# Patient Record
Sex: Male | Born: 1960 | Race: White | Hispanic: No | Marital: Married | Smoking: Never smoker
Health system: Southern US, Community
[De-identification: ages and names within clinical notes are randomized; demographics above are authoritative.]

## PROBLEM LIST (undated history)

## (undated) DIAGNOSIS — Z87442 Personal history of urinary calculi: Secondary | ICD-10-CM

## (undated) DIAGNOSIS — E039 Hypothyroidism, unspecified: Secondary | ICD-10-CM

## (undated) DIAGNOSIS — R7303 Prediabetes: Secondary | ICD-10-CM

## (undated) DIAGNOSIS — N200 Calculus of kidney: Secondary | ICD-10-CM

## (undated) DIAGNOSIS — E785 Hyperlipidemia, unspecified: Secondary | ICD-10-CM

## (undated) DIAGNOSIS — K635 Polyp of colon: Secondary | ICD-10-CM

## (undated) HISTORY — DX: Personal history of urinary calculi: Z87.442

## (undated) HISTORY — PX: TONSILLECTOMY AND ADENOIDECTOMY: SHX28

## (undated) HISTORY — DX: Hyperlipidemia, unspecified: E78.5

## (undated) HISTORY — DX: Prediabetes: R73.03

## (undated) HISTORY — DX: Hypothyroidism, unspecified: E03.9

## (undated) HISTORY — DX: Polyp of colon: K63.5

## (undated) HISTORY — PX: HALLUX VALGUS CORRECTION: SUR315

---

## 2008-01-25 ENCOUNTER — Ambulatory Visit: Payer: Self-pay | Admitting: Podiatry

## 2008-02-02 ENCOUNTER — Ambulatory Visit: Payer: Self-pay | Admitting: Podiatry

## 2012-05-05 ENCOUNTER — Ambulatory Visit: Payer: Self-pay | Admitting: Podiatry

## 2012-07-11 ENCOUNTER — Emergency Department: Payer: Self-pay | Admitting: Emergency Medicine

## 2012-07-11 LAB — CBC
HCT: 43.6 % (ref 40.0–52.0)
HGB: 14.9 g/dL (ref 13.0–18.0)
MCH: 30.3 pg (ref 26.0–34.0)
MCHC: 34.2 g/dL (ref 32.0–36.0)
MCV: 89 fL (ref 80–100)
Platelet: 190 10*3/uL (ref 150–440)
RBC: 4.92 10*6/uL (ref 4.40–5.90)
RDW: 13.4 % (ref 11.5–14.5)
WBC: 10.2 10*3/uL (ref 3.8–10.6)

## 2012-07-11 LAB — URINALYSIS, COMPLETE
Bilirubin,UR: NEGATIVE
Glucose,UR: NEGATIVE mg/dL (ref 0–75)
Granular Cast: 1
Hyaline Cast: 8
Nitrite: NEGATIVE
Protein: 30
RBC,UR: 231 /HPF (ref 0–5)
Specific Gravity: 1.025 (ref 1.003–1.030)
WBC UR: 5 /HPF (ref 0–5)

## 2012-07-11 LAB — BASIC METABOLIC PANEL
Anion Gap: 8 (ref 7–16)
Calcium, Total: 9.6 mg/dL (ref 8.5–10.1)
Chloride: 106 mmol/L (ref 98–107)
Co2: 25 mmol/L (ref 21–32)
EGFR (African American): 58 — ABNORMAL LOW
Glucose: 134 mg/dL — ABNORMAL HIGH (ref 65–99)
Osmolality: 282 (ref 275–301)
Potassium: 3.8 mmol/L (ref 3.5–5.1)

## 2013-10-26 ENCOUNTER — Ambulatory Visit: Payer: Self-pay | Admitting: Gastroenterology

## 2013-10-26 HISTORY — PX: COLONOSCOPY: SHX174

## 2013-10-29 LAB — PATHOLOGY REPORT

## 2014-04-24 ENCOUNTER — Emergency Department: Payer: Self-pay | Admitting: Emergency Medicine

## 2014-04-24 LAB — URINALYSIS, COMPLETE
Bacteria: NONE SEEN
Bilirubin,UR: NEGATIVE
Blood: NEGATIVE
Glucose,UR: NEGATIVE mg/dL (ref 0–75)
Ketone: NEGATIVE
Nitrite: NEGATIVE
Ph: 5 (ref 4.5–8.0)
Protein: NEGATIVE
RBC,UR: 6 /HPF (ref 0–5)
Specific Gravity: 1.024 (ref 1.003–1.030)
Squamous Epithelial: NONE SEEN

## 2014-04-24 LAB — BASIC METABOLIC PANEL
ANION GAP: 8 (ref 7–16)
BUN: 14 mg/dL (ref 7–18)
CREATININE: 1.34 mg/dL — AB (ref 0.60–1.30)
Calcium, Total: 9.1 mg/dL (ref 8.5–10.1)
Chloride: 101 mmol/L (ref 98–107)
Co2: 26 mmol/L (ref 21–32)
GFR CALC NON AF AMER: 60 — AB
Glucose: 118 mg/dL — ABNORMAL HIGH (ref 65–99)
OSMOLALITY: 272 (ref 275–301)
Potassium: 3.6 mmol/L (ref 3.5–5.1)
Sodium: 135 mmol/L — ABNORMAL LOW (ref 136–145)

## 2014-04-24 LAB — CBC
HCT: 45.9 % (ref 40.0–52.0)
HGB: 15.2 g/dL (ref 13.0–18.0)
MCH: 29.6 pg (ref 26.0–34.0)
MCHC: 33.2 g/dL (ref 32.0–36.0)
MCV: 89 fL (ref 80–100)
PLATELETS: 218 10*3/uL (ref 150–440)
RBC: 5.15 10*6/uL (ref 4.40–5.90)
RDW: 13.7 % (ref 11.5–14.5)
WBC: 9 10*3/uL (ref 3.8–10.6)

## 2014-09-10 NOTE — Op Note (Signed)
PATIENT NAME:  Brandon Benitez, Brandon Benitez MR#:  [id number] DATE OF BIRTH:  1960/08/01  DATE OF PROCEDURE:  05/05/2012  PREOPERATIVE DIAGNOSIS: Hallux valgus deformity, right foot.   POSTOPERATIVE DIAGNOSIS: Hallux valgus deformity, right foot.   PROCEDURE: Hallux valgus correction, right foot.   SURGEON: Linus Galasodd Shamiyah Ngu, DPM  ANESTHESIA: Local MAC.   HEMOSTASIS: Pneumatic tourniquet, right ankle, 250 mmHg.   ESTIMATED BLOOD LOSS: Minimal.   MATERIALS: One 0.062-inch K wire.   PATHOLOGY: None.   COMPLICATIONS: None apparent.   OPERATIVE INDICATIONS: This is a 54 year old male with some chronic pain with hallux valgus deformity on his right foot. He elects for surgical intervention as was previously performed on the left foot.   OPERATIVE PROCEDURE: The patient was taken to the operating room and placed on the table in the supine position. Following satisfactory sedation, the right foot was anesthetized with 10 mL of 0.5% Sensorcaine plain around the first metatarsal. Pneumatic tourniquet was applied at the level of the right ankle and the foot was prepped and draped in the usual sterile fashion. The foot was exsanguinated and the tourniquet inflated to 250 mmHg.   Attention was then directed to the dorsal aspect of the right foot where an approximate 5 cm linear incision was made coursing proximal to distal centered over the first metatarsal and metatarsophalangeal joint. The incision was deepened via sharp and blunt dissection down to the level of the joint where a linear capsulotomy was performed. The capsular and periosteal tissues were reflected off of the medial and dorsal aspect of the head of the first metatarsal. There was a medial prominence with some arthritic cartilage loss along the medial joint line. Using a pneumatic saw, the medial eminence and dorsal prominence were removed in toto using a pneumatic saw. Next, a 0.045-inch K wire was then driven from medial to lateral as an axis guide and a  V-shaped osteotomy was performed through the head of the first metatarsal. The head was freely mobilized and the pin was removed.   Attention was then directed to the first interspace where the incision was deepened via sharp and blunt dissection down to the level of the transverse intermetatarsal ligament which was incised. The adductor tendon was freed from the lateral aspect of the joint and a small portion was removed. A lateral capsulotomy with freeing of the sesamoid apparatus was performed. There was noted to be good release of the contracture on the great toe. Attention was then directed back medially where the capital fragment was mobilized laterally and fixated using a 0.062-inch K wire. Intraoperative FluoroScan views revealed good reduction of the deformity and K wire placement. The redundant bone medially was then resected and the edges were rasped smooth. The wound was then flushed with copious amounts of sterile saline and closed in layers using 4-0 Vicryl running suture for all layers from capsular and periosteal closure to deep and superficial and skin closure. Tincture of benzoin and Steri-Strips were applied followed by Xeroform and a sterile bandage. Tourniquet was released and blood flow noted to return immediately to the right foot in digits one through five. A posterior plaster splint was then applied to the right foot with the foot 90 degrees relative to the leg. The patient tolerated the procedure and anesthesia well and was transported to PAC-U with vital signs stable and in good condition.   ____________________________ Linus Galasodd Winna Golla, DPM tc:drc D: 05/05/2012 09:20:20 ET T: 05/05/2012 09:47:35 ET JOB#: [id number]  cc: Linus Galasodd Jesson Foskey, DPM, <Dictator> Klinton Candelas DPM  ELECTRONICALLY SIGNED 05/23/2012 14:57

## 2014-11-06 ENCOUNTER — Encounter: Payer: Self-pay | Admitting: Emergency Medicine

## 2014-11-06 ENCOUNTER — Emergency Department: Payer: BLUE CROSS/BLUE SHIELD

## 2014-11-06 ENCOUNTER — Emergency Department
Admission: EM | Admit: 2014-11-06 | Discharge: 2014-11-06 | Disposition: A | Discharge status: Home / Self Care | Payer: BLUE CROSS/BLUE SHIELD | Attending: Student | Admitting: Student

## 2014-11-06 DIAGNOSIS — R1030 Lower abdominal pain, unspecified: Secondary | ICD-10-CM | POA: Diagnosis present

## 2014-11-06 DIAGNOSIS — N200 Calculus of kidney: Secondary | ICD-10-CM | POA: Insufficient documentation

## 2014-11-06 HISTORY — DX: Calculus of kidney: N20.0

## 2014-11-06 LAB — URINALYSIS COMPLETE WITH MICROSCOPIC (ARMC ONLY)
BACTERIA UA: NONE SEEN
BILIRUBIN URINE: NEGATIVE
GLUCOSE, UA: NEGATIVE mg/dL
KETONES UR: NEGATIVE mg/dL
Leukocytes, UA: NEGATIVE
Nitrite: NEGATIVE
Protein, ur: NEGATIVE mg/dL
SPECIFIC GRAVITY, URINE: 1.016 (ref 1.005–1.030)
pH: 5 (ref 5.0–8.0)

## 2014-11-06 LAB — COMPREHENSIVE METABOLIC PANEL
ALBUMIN: 4.2 g/dL (ref 3.5–5.0)
ALT: 34 U/L (ref 17–63)
ANION GAP: 8 (ref 5–15)
AST: 35 U/L (ref 15–41)
Alkaline Phosphatase: 51 U/L (ref 38–126)
BUN: 12 mg/dL (ref 6–20)
CHLORIDE: 105 mmol/L (ref 101–111)
CO2: 25 mmol/L (ref 22–32)
Calcium: 9.2 mg/dL (ref 8.9–10.3)
Creatinine, Ser: 1.52 mg/dL — ABNORMAL HIGH (ref 0.61–1.24)
GFR calc Af Amer: 60 mL/min — ABNORMAL LOW (ref >60)
GFR, EST NON AFRICAN AMERICAN: 51 mL/min — AB (ref >60)
Glucose, Bld: 180 mg/dL — ABNORMAL HIGH (ref 65–99)
Potassium: 3.7 mmol/L (ref 3.5–5.1)
SODIUM: 138 mmol/L (ref 135–145)
TOTAL PROTEIN: 6.9 g/dL (ref 6.5–8.1)
Total Bilirubin: 0.4 mg/dL (ref 0.3–1.2)

## 2014-11-06 LAB — CBC WITH DIFFERENTIAL/PLATELET
Basophils Absolute: 0.1 10*3/uL (ref 0–0.1)
Basophils Relative: 1 %
EOS ABS: 0.3 10*3/uL (ref 0–0.7)
EOS PCT: 5 %
HEMATOCRIT: 43.2 % (ref 40.0–52.0)
Hemoglobin: 14.4 g/dL (ref 13.0–18.0)
LYMPHS PCT: 29 %
Lymphs Abs: 1.8 10*3/uL (ref 1.0–3.6)
MCH: 29.7 pg (ref 26.0–34.0)
MCHC: 33.4 g/dL (ref 32.0–36.0)
MCV: 89 fL (ref 80.0–100.0)
MONO ABS: 0.2 10*3/uL (ref 0.2–1.0)
Monocytes Relative: 4 %
Neutro Abs: 3.7 10*3/uL (ref 1.4–6.5)
Neutrophils Relative %: 61 %
PLATELETS: 173 10*3/uL (ref 150–440)
RBC: 4.86 MIL/uL (ref 4.40–5.90)
RDW: 13.6 % (ref 11.5–14.5)
WBC: 6.2 10*3/uL (ref 3.8–10.6)

## 2014-11-06 LAB — LIPASE, BLOOD: Lipase: 35 U/L (ref 22–51)

## 2014-11-06 MED ORDER — MORPHINE SULFATE 4 MG/ML IJ SOLN
INTRAMUSCULAR | Status: AC
  Administered 2014-11-06: 4 mg via INTRAVENOUS
  Filled 2014-11-06: qty 1

## 2014-11-06 MED ORDER — ONDANSETRON HCL 4 MG/2ML IJ SOLN
INTRAMUSCULAR | Status: AC
  Administered 2014-11-06: 4 mg via INTRAVENOUS
  Filled 2014-11-06: qty 2

## 2014-11-06 MED ORDER — OXYCODONE HCL 5 MG PO TABS: 5.0000 mg | ORAL_TABLET | Freq: Four times a day (QID) | ORAL | Status: DC | PRN

## 2014-11-06 MED ORDER — ONDANSETRON 4 MG PO TBDP: 4.0000 mg | ORAL_TABLET | Freq: Three times a day (TID) | ORAL | Status: DC | PRN

## 2014-11-06 MED ORDER — KETOROLAC TROMETHAMINE 30 MG/ML IJ SOLN
30.0000 mg | Freq: Once | INTRAMUSCULAR | Status: AC
  Administered 2014-11-06: 30 mg via INTRAVENOUS

## 2014-11-06 MED ORDER — ONDANSETRON HCL 4 MG/2ML IJ SOLN
4.0000 mg | Freq: Once | INTRAMUSCULAR | Status: AC
  Administered 2014-11-06: 4 mg via INTRAVENOUS

## 2014-11-06 MED ORDER — KETOROLAC TROMETHAMINE 30 MG/ML IJ SOLN
INTRAMUSCULAR | Status: AC
  Administered 2014-11-06: 30 mg via INTRAVENOUS
  Filled 2014-11-06: qty 1

## 2014-11-06 MED ORDER — TAMSULOSIN HCL 0.4 MG PO CAPS: 0.4000 mg | ORAL_CAPSULE | Freq: Every day | ORAL | Status: DC

## 2014-11-06 MED ORDER — SODIUM CHLORIDE 0.9 % IV BOLUS (SEPSIS)
500.0000 mL | Freq: Once | INTRAVENOUS | Status: AC
  Administered 2014-11-06: 500 mL via INTRAVENOUS

## 2014-11-06 MED ORDER — MORPHINE SULFATE 4 MG/ML IJ SOLN
4.0000 mg | Freq: Once | INTRAMUSCULAR | Status: AC
  Administered 2014-11-06: 4 mg via INTRAVENOUS

## 2014-11-06 NOTE — ED Notes (Signed)
Pt presents with lower abd pain, possible kidney stones.

## 2014-11-06 NOTE — ED Provider Notes (Signed)
[facility] Emergency Department Provider Note  ____________________________________________  Time seen: Approximately 7:52 AM  I have reviewed the triage vital signs and the nursing notes.   HISTORY  Chief Complaint Abdominal Pain    HPI Brandon Benitez is a 54 y.o. male with history of hyperlipidemia, kidney stones who presents for evaluation of 4 days constant throbbing central lower abdominal pain, gradual onset, worsening today. He is also having urinary hesitancy. No nausea, vomiting, diarrhea, fevers or chills. Current severity is moderate. No modifying factors. No chest pain or difficulty breathing. He reports it "feels like a kidney stone but is in my bladder".   Past Medical History  Diagnosis Date  . Kidney stones   . Kidney stones     There are no active problems to display for this patient.   History reviewed. No pertinent past surgical history.  No current outpatient prescriptions on file.  Allergies Review of patient's allergies indicates no known allergies.  No family history on file.  Social History History  Substance Use Topics  . Smoking status: Never Smoker   . Smokeless tobacco: Not on file  . Alcohol Use: No    Review of Systems Constitutional: No fever/chills Eyes: No visual changes. ENT: No sore throat. Cardiovascular: Denies chest pain. Respiratory: Denies shortness of breath. Gastrointestinal: + abdominal pain.  No nausea, no vomiting.  No diarrhea.  No constipation. Genitourinary: Negative for dysuria. Musculoskeletal: Negative for back pain. Skin: Negative for rash. Neurological: Negative for headaches, focal weakness or numbness.  10-point ROS otherwise negative.  ____________________________________________   PHYSICAL EXAM:  VITAL SIGNS: ED Triage Vitals  Enc Vitals Group     BP 11/06/14 0743 127/76 mmHg     Pulse Rate 11/06/14 0743 66     Resp 11/06/14 0743 18     Temp 11/06/14 0743 98.2 F  (36.8 C)     Temp Source 11/06/14 0743 Oral     SpO2 11/06/14 0743 99 %     Weight 11/06/14 0740 180 lb (81.647 kg)     Height 11/06/14 0740 5\' 7"  (1.702 m)     Head Cir --      Peak Flow --      Pain Score 11/06/14 0741 10     Pain Loc --      Pain Edu? --      Excl. in GC? --     Constitutional: Alert and oriented. Well appearing and in no acute distress. Eyes: Conjunctivae are normal. PERRL. EOMI. Head: Atraumatic. Nose: No congestion/rhinnorhea. Mouth/Throat: Mucous membranes are moist.  Oropharynx non-erythematous. Neck: No stridor.   Cardiovascular: Normal rate, regular rhythm. Grossly normal heart sounds.  Good peripheral circulation. Respiratory: Normal respiratory effort.  No retractions. Lungs CTAB. Gastrointestinal: Soft and with faint suprapubic tenderness, no tenderness to palpation in the RLQ or RUQ. No distention. No abdominal bruits. No CVA tenderness. Genitourinary: deferred Musculoskeletal: No lower extremity tenderness nor edema.  No joint effusions. Neurologic:  Normal speech and language. No gross focal neurologic deficits are appreciated. Speech is normal. No gait instability. Skin:  Skin is warm, dry and intact. No rash noted. Psychiatric: Mood and affect are normal. Speech and behavior are normal.  ____________________________________________   LABS (all labs ordered are listed, but only abnormal results are displayed)  Labs Reviewed  COMPREHENSIVE METABOLIC PANEL - Abnormal; Notable for the following:    Glucose, Bld 180 (*)    Creatinine, Ser 1.52 (*)    GFR calc non Af Denyse Dago  51 (*)    GFR calc Af Amer 60 (*)    All other components within normal limits  URINALYSIS COMPLETEWITH MICROSCOPIC ([facility] ONLY) - Abnormal; Notable for the following:    Color, Urine YELLOW (*)    APPearance CLEAR (*)    Hgb urine dipstick 3+ (*)    Squamous Epithelial / LPF 0-5 (*)    All other components within normal limits  CBC WITH DIFFERENTIAL/PLATELET  LIPASE,  BLOOD   ____________________________________________  EKG  none ____________________________________________  RADIOLOGY CT Abdomen and pelvis IMPRESSION: 1. Again noted multiple calcified nonobstructive renal calculi bilaterally. 2. There is mild left hydronephrosis and left hydroureter. Axial image 75 there is 2.2 mm calcified obstructive calculus in distal left ureter about 9 mm from left UVJ. 3. No right hydronephrosis. 4. Moderate stool noted in right colon and transverse colon. No pericecal inflammation. Normal appendix partially visualized. 5. No small bowel obstruction. 6. Again noted hepatic steatosis.  ____________________________________________   PROCEDURES  Procedure(s) performed: None  Critical Care performed: No  ____________________________________________   INITIAL IMPRESSION / ASSESSMENT AND PLAN / ED COURSE  Pertinent labs & imaging results that were available during my care of the patient were reviewed by me and considered in my medical decision making (see chart for details).  Brandon Benitez is a 54 y.o. male with history of hyperlipidemia, kidney stones who presents for evaluation of 4 days constant throbbing central lower abdominal pain, gradual onset, worsening today. On exam, he is generally well-appearing. Vital signs stable, he is afebrile. He has very faint suprapubic tenderness. Plan for screening labs, pain control, urinalysis possible CT of the abdomen and pelvis.  ----------------------------------------- 10:59 AM on 11/06/2014 -----------------------------------------  CT shows 2.2 mm obstructive calculus in the distal left ureter with mild left hydronephrosis. Discussed with Dr. Apolinar Junes who recommends discharge with expectant management. Patient reports his pain is currently well controlled. UA does not appear consistent with bacterial infection, yeast are present but the patient is not at risk for disseminated infection and there are  no indications for treatment currently.  Cr at baseline. Discussed return precautions, expectant management. He is comfortable with the discharge plan. ____________________________________________   FINAL CLINICAL IMPRESSION(S) / ED DIAGNOSES  Final diagnoses:  Suprapubic pain, acute, unspecified laterality  Nephrolithiasis      Gayla Doss, MD 11/06/14 1103

## 2014-11-06 NOTE — ED Notes (Signed)
Patient transported to CT 

## 2014-11-18 ENCOUNTER — Ambulatory Visit: Payer: Self-pay | Admitting: Urology

## 2016-10-16 ENCOUNTER — Emergency Department
Admission: EM | Admit: 2016-10-16 | Discharge: 2016-10-16 | Disposition: A | Discharge status: Home / Self Care | Payer: Managed Care, Other (non HMO) | Attending: Emergency Medicine | Admitting: Emergency Medicine

## 2016-10-16 ENCOUNTER — Emergency Department: Payer: Managed Care, Other (non HMO)

## 2016-10-16 DIAGNOSIS — N200 Calculus of kidney: Secondary | ICD-10-CM

## 2016-10-16 DIAGNOSIS — Z7902 Long term (current) use of antithrombotics/antiplatelets: Secondary | ICD-10-CM | POA: Diagnosis not present

## 2016-10-16 DIAGNOSIS — R1032 Left lower quadrant pain: Secondary | ICD-10-CM | POA: Diagnosis present

## 2016-10-16 DIAGNOSIS — Z79899 Other long term (current) drug therapy: Secondary | ICD-10-CM | POA: Diagnosis not present

## 2016-10-16 LAB — URINALYSIS, COMPLETE (UACMP) WITH MICROSCOPIC
Bacteria, UA: NONE SEEN
Bilirubin Urine: NEGATIVE
Glucose, UA: NEGATIVE mg/dL
KETONES UR: NEGATIVE mg/dL
Leukocytes, UA: NEGATIVE
Nitrite: NEGATIVE
PH: 5 (ref 5.0–8.0)
Protein, ur: NEGATIVE mg/dL
Specific Gravity, Urine: 1.02 (ref 1.005–1.030)
Squamous Epithelial / LPF: NONE SEEN

## 2016-10-16 LAB — COMPREHENSIVE METABOLIC PANEL
ALT: 31 U/L (ref 17–63)
AST: 33 U/L (ref 15–41)
Albumin: 4.5 g/dL (ref 3.5–5.0)
Alkaline Phosphatase: 49 U/L (ref 38–126)
Anion gap: 7 (ref 5–15)
BUN: 19 mg/dL (ref 6–20)
CHLORIDE: 102 mmol/L (ref 101–111)
CO2: 24 mmol/L (ref 22–32)
Calcium: 9.3 mg/dL (ref 8.9–10.3)
Creatinine, Ser: 1.37 mg/dL — ABNORMAL HIGH (ref 0.61–1.24)
GFR calc Af Amer: >60 mL/min (ref >60)
GFR calc non Af Amer: 57 mL/min — ABNORMAL LOW (ref >60)
Glucose, Bld: 132 mg/dL — ABNORMAL HIGH (ref 65–99)
POTASSIUM: 4.4 mmol/L (ref 3.5–5.1)
Sodium: 133 mmol/L — ABNORMAL LOW (ref 135–145)
Total Bilirubin: 0.6 mg/dL (ref 0.3–1.2)
Total Protein: 7.2 g/dL (ref 6.5–8.1)

## 2016-10-16 LAB — CBC
HCT: 44.4 % (ref 40.0–52.0)
Hemoglobin: 15 g/dL (ref 13.0–18.0)
MCH: 29 pg (ref 26.0–34.0)
MCHC: 33.8 g/dL (ref 32.0–36.0)
MCV: 85.7 fL (ref 80.0–100.0)
Platelets: 213 10*3/uL (ref 150–440)
RBC: 5.19 MIL/uL (ref 4.40–5.90)
RDW: 13.8 % (ref 11.5–14.5)
WBC: 11.1 10*3/uL — ABNORMAL HIGH (ref 3.8–10.6)

## 2016-10-16 LAB — LIPASE, BLOOD: Lipase: 25 U/L (ref 11–51)

## 2016-10-16 MED ORDER — ONDANSETRON 4 MG PO TBDP: 4.0000 mg | ORAL_TABLET | Freq: Four times a day (QID) | ORAL | 0 refills | Status: DC | PRN

## 2016-10-16 MED ORDER — KETOROLAC TROMETHAMINE 30 MG/ML IJ SOLN
30.0000 mg | Freq: Once | INTRAMUSCULAR | Status: AC
  Administered 2016-10-16: 30 mg via INTRAVENOUS

## 2016-10-16 MED ORDER — KETOROLAC TROMETHAMINE 30 MG/ML IJ SOLN
INTRAMUSCULAR | Status: AC
  Administered 2016-10-16: 30 mg via INTRAVENOUS
  Filled 2016-10-16: qty 1

## 2016-10-16 MED ORDER — TAMSULOSIN HCL 0.4 MG PO CAPS: 0.4000 mg | ORAL_CAPSULE | Freq: Every day | ORAL | 0 refills | Status: DC

## 2016-10-16 MED ORDER — SODIUM CHLORIDE 0.9 % IV BOLUS (SEPSIS)
1000.0000 mL | Freq: Once | INTRAVENOUS | Status: AC
  Administered 2016-10-16: 1000 mL via INTRAVENOUS

## 2016-10-16 MED ORDER — OXYCODONE HCL 5 MG PO TABS: 5.0000 mg | ORAL_TABLET | Freq: Four times a day (QID) | ORAL | 0 refills | Status: DC | PRN

## 2016-10-16 MED ORDER — OXYCODONE-ACETAMINOPHEN 5-325 MG PO TABS
1.0000 | ORAL_TABLET | ORAL | Status: AC
  Administered 2016-10-16: 1 via ORAL
  Filled 2016-10-16: qty 1

## 2016-10-16 MED ORDER — ONDANSETRON HCL 4 MG/2ML IJ SOLN
4.0000 mg | Freq: Once | INTRAMUSCULAR | Status: AC
  Administered 2016-10-16: 4 mg via INTRAVENOUS
  Filled 2016-10-16: qty 2

## 2016-10-16 NOTE — ED Provider Notes (Signed)
[facility] Emergency Department Provider Note  ____________________________________________   First MD Initiated Contact with Patient 10/16/16 1542     (approximate)  I have reviewed the triage vital signs and the nursing notes.   HISTORY  Chief Complaint Abdominal Pain  HPI Brandon Benitez is a 56 y.o. male who reports that he is a "kidney stone" on the left.  Patient reports he has a history of kidney stones and today at about 10:00 PM extrinsic pain like someone "punched him" in the left back. He reports he's been having a steady constant pain in the left back and sharp and difficult to really relieve. No pain in the groin or testicles. Some nausea but no vomiting. No chest pain or trouble breathing has not seen blood in his urine. No fevers or chills  Took a Flomax tablet at home.  Past Medical History:  Diagnosis Date  . Kidney stones   . Kidney stones     There are no active problems to display for this patient.   History reviewed. No pertinent surgical history.  Prior to Admission medications   Medication Sig Start Date End Date Taking? Authorizing Provider  fenofibrate (TRICOR) 145 MG tablet Take 145 mg by mouth daily. 08/29/16  Yes [provider]  levothyroxine (SYNTHROID, LEVOTHROID) 125 MCG tablet Take 125 mcg by mouth daily. 08/29/16  Yes [provider]  omeprazole (PRILOSEC) 20 MG capsule Take 20 mg by mouth daily. 09/08/16  Yes [provider]  simvastatin (ZOCOR) 40 MG tablet Take 40 mg by mouth daily. 09/08/16  Yes [provider]  ondansetron (ZOFRAN ODT) 4 MG disintegrating tablet Take 1 tablet (4 mg total) by mouth every 6 (six) hours as needed for nausea or vomiting. 10/16/16   Sharyn Creamer, MD  oxyCODONE (OXY IR/ROXICODONE) 5 MG immediate release tablet Take 1 tablet (5 mg total) by mouth every 6 (six) hours as needed for severe pain. 10/16/16   Sharyn Creamer, MD  tamsulosin (FLOMAX) 0.4 MG CAPS  capsule Take 1 capsule (0.4 mg total) by mouth daily. 10/16/16   Sharyn Creamer, MD    Allergies Patient has no known allergies.  No family history on file.  Social History Social History  Substance Use Topics  . Smoking status: Never Smoker  . Smokeless tobacco: Not on file  . Alcohol use No    Review of Systems Constitutional: No fever/chills Eyes: No visual changes. ENT: No sore throat. Cardiovascular: Denies chest pain. Respiratory: Denies shortness of breath. Gastrointestinal: See history of present illness No vomiting.  No diarrhea.  No constipation. Genitourinary: Negative for dysuria. Musculoskeletal: Negative for back pain. Skin: Negative for rash. Neurological: Negative for headaches, focal weakness or numbness.  10-point ROS otherwise negative.  ____________________________________________   PHYSICAL EXAM:  VITAL SIGNS: ED Triage Vitals  Enc Vitals Group     BP 10/16/16 1505 122/74     Pulse Rate 10/16/16 1505 64     Resp 10/16/16 1505 19     Temp 10/16/16 1505 97.8 F (36.6 C)     Temp Source 10/16/16 1505 Oral     SpO2 10/16/16 1505 100 %     Weight 10/16/16 1506 180 lb (81.6 kg)     Height 10/16/16 1506 5\' 7"  (1.702 m)     Head Circumference --      Peak Flow --      Pain Score 10/16/16 1505 10     Pain Loc --  Pain Edu? --      Excl. in GC? --     Constitutional: Alert and oriented. Well appearing and in no acute distress. Eyes: Conjunctivae are normal. PERRL. EOMI. Head: Atraumatic. Nose: No congestion/rhinnorhea. Mouth/Throat: Mucous membranes are moist.  Oropharynx non-erythematous. Neck: No stridor.   Cardiovascular: Normal rate, regular rhythm. Grossly normal heart sounds.  Good peripheral circulation. Respiratory: Normal respiratory effort.  No retractions. Lungs CTAB. Gastrointestinal: Soft and nontender.  No abdominal bruits. Moderate pain left flank. No rebound or guarding throughout the abdomen Musculoskeletal: No lower  extremity tenderness nor edema.  No joint effusions. Neurologic:  Normal speech and language. No gross focal neurologic deficits are appreciated.  Skin:  Skin is warm, dry and intact. No rash noted. Psychiatric: Mood and affect are normal. Speech and behavior are normal.  ____________________________________________   LABS (all labs ordered are listed, but only abnormal results are displayed)  Labs Reviewed  CBC - Abnormal; Notable for the following:       Result Value   WBC 11.1 (*)    All other components within normal limits  COMPREHENSIVE METABOLIC PANEL - Abnormal; Notable for the following:    Sodium 133 (*)    Glucose, Bld 132 (*)    Creatinine, Ser 1.37 (*)    GFR calc non Af Amer 57 (*)    All other components within normal limits  URINALYSIS, COMPLETE (UACMP) WITH MICROSCOPIC - Abnormal; Notable for the following:    Color, Urine YELLOW (*)    APPearance CLEAR (*)    Hgb urine dipstick LARGE (*)    All other components within normal limits  LIPASE, BLOOD   ____________________________________________  EKG   ____________________________________________  RADIOLOGY  Ct Renal Stone Study  Result Date: 10/16/2016 CLINICAL DATA:  Left-sided flank pain starting this morning. History of kidney stones. EXAM: CT ABDOMEN AND PELVIS WITHOUT CONTRAST TECHNIQUE: Multidetector CT imaging of the abdomen and pelvis was performed following the standard protocol without IV contrast. COMPARISON:  CT abdomen dated 11/06/2014. FINDINGS: Lower chest: No acute abnormality. Hepatobiliary: Fatty infiltration of the liver. Gallbladder is unremarkable. No bile duct dilatation. Pancreas: Unremarkable. No pancreatic ductal dilatation or surrounding inflammatory changes. Spleen: Normal in appearance. Adrenals/Urinary Tract: Adrenal glands are unremarkable. 3 mm stone within the distal left ureter causing moderate left-sided hydronephrosis with associated perinephric edema. At least 7 left renal  stones, measuring 1-5 mm in size. At least 9 right renal stones, measuring 1-6 mm in size. No right-sided hydronephrosis. No right-sided ureteral stone. Bladder is decompressed. Stomach/Bowel: Bowel is normal in caliber. No bowel wall thickening or evidence of bowel wall inflammation seen. Appendix is normal. Vascular/Lymphatic: No significant vascular findings are present. No enlarged abdominal or pelvic lymph nodes. Reproductive: Prostate is unremarkable. Other: No free fluid or abscess collections seen. No free intraperitoneal air. 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 No acute or suspicious osseous finding. IMPRESSION: 1. 3 mm stone within the distal left ureter causing moderate left-sided hydronephrosis with associated perinephric edema. 2. Bilateral nephrolithiasis. 3. Fatty infiltration of the liver. Electronically Signed   By: Bary RichardStan  Maynard M.D.   On: 10/16/2016 16:16    ____________________________________________   PROCEDURES  Procedure(s) performed: None  Procedures  Critical Care performed: No  ____________________________________________   INITIAL IMPRESSION / ASSESSMENT AND PLAN / ED COURSE  Pertinent labs & imaging results that were available during my care of the patient were reviewed by me and considered in my medical decision making (  see chart for details).  Differential diagnosis includes but is not limited to, abdominal perforation, aortic dissection, cholecystitis, appendicitis, diverticulitis, colitis, esophagitis/gastritis, kidney stone, pyelonephritis, urinary tract infection, aortic aneurysm. All are considered in decision and treatment plan. Based upon the patient's presentation and risk factors, suspect patient has another kidney stone based on the history and exam. We will obtain a repeat CT, discussed ultrasound versus CT with the patient and he would like to have a CAT scan done after we  discussed the risks including relevant risks of radiation.   Clinical Course as of Oct 17 1719  Sat Oct 16, 2016  1710 Patient reports pain free at this time. Reviewed plan of care and CT scan. Patient agreeable and comfortable with outpatient management. He is out of oxycodone, I will give him additional for this kidney stone. His wife is driving home. I will prescribe the patient a narcotic pain medicine due to their condition which I anticipate will cause at least moderate pain short term. I discussed with the patient safe use of narcotic pain medicines, and that they are not to drive, work in dangerous areas, or ever take more than prescribed (no more than 1 pill every 6 hours). We discussed that this is the type of medication that can be  overdosed on and the risks of this type of medicine. Patient is very agreeable to only use as prescribed and to never use more than prescribed.   [MQ]    Clinical Course User Index [MQ] Sharyn Creamer, MD    Patient resting comfortably. No distress. We'll discharge home, following up closely with urology.  Return precautions and treatment recommendations and follow-up discussed with the patient who is agreeable with the plan.  ____________________________________________   FINAL CLINICAL IMPRESSION(S) / ED DIAGNOSES  Final diagnoses:  Kidney stone on left side      NEW MEDICATIONS STARTED DURING THIS VISIT:  New Prescriptions   ONDANSETRON (ZOFRAN ODT) 4 MG DISINTEGRATING TABLET    Take 1 tablet (4 mg total) by mouth every 6 (six) hours as needed for nausea or vomiting.   OXYCODONE (OXY IR/ROXICODONE) 5 MG IMMEDIATE RELEASE TABLET    Take 1 tablet (5 mg total) by mouth every 6 (six) hours as needed for severe pain.   TAMSULOSIN (FLOMAX) 0.4 MG CAPS CAPSULE    Take 1 capsule (0.4 mg total) by mouth daily.     Note:  This document was prepared using Dragon voice recognition software and may include unintentional dictation errors.     Sharyn Creamer, MD 10/16/16 1725

## 2016-10-16 NOTE — ED Triage Notes (Signed)
Pt comes from home for c/o left flank pain that radiates to LLQ pain that started today. Pt states nausea and vomiting. Pt states he took 2 perocets at 11am, 2 more at 2pm, flomax at 11am, zofran tablet at 11am and about 2:30pm. Pt states no relief. Pt appears uncomfortable in triage. Pt vomiting in triage.

## 2016-10-16 NOTE — Discharge Instructions (Signed)

## 2017-10-13 IMAGING — CT CT RENAL STONE PROTOCOL
2 of 4 series · 16 of 46 positions shown, 18 images · non-contrast
Comparison: CT abdomen dated 11/06/2014.

CLINICAL DATA: Left-sided flank pain starting this morning. History
of kidney stones.

EXAM:
CT ABDOMEN AND PELVIS WITHOUT CONTRAST
TECHNIQUE: Multidetector CT imaging of the abdomen and pelvis was performed
following the standard protocol without IV contrast.

[Series 2: stone full standard · axial · 0.81mm/px · z∈[-539,-99]mm · 13 of 98 slices shown, 15 images]
[im 5/98  soft-tissue]
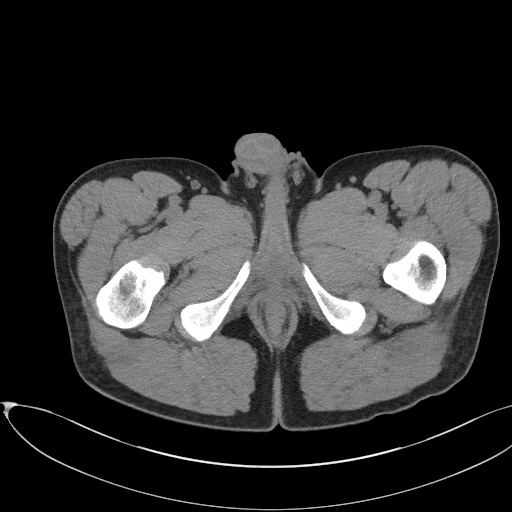
[im 5/98  bone]
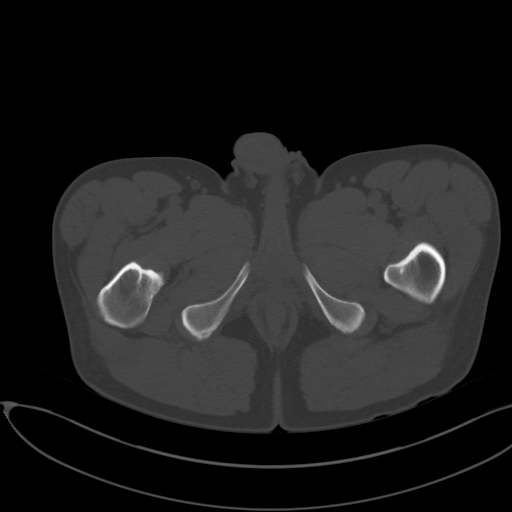
[im 13/98  soft-tissue]
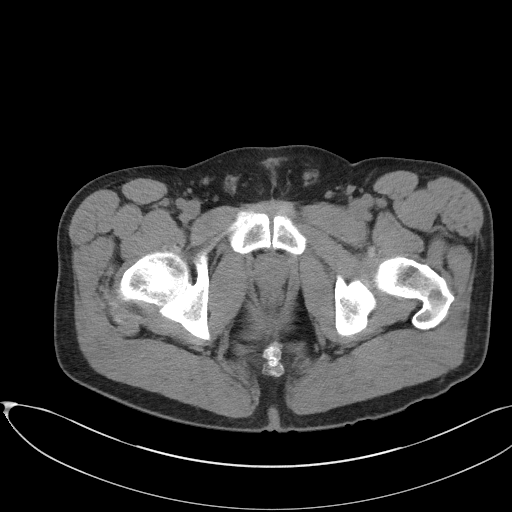
[im 22/98  soft-tissue]
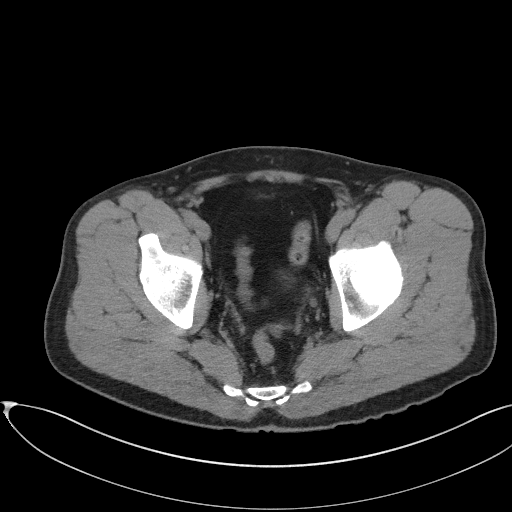
[im 26/98  soft-tissue]
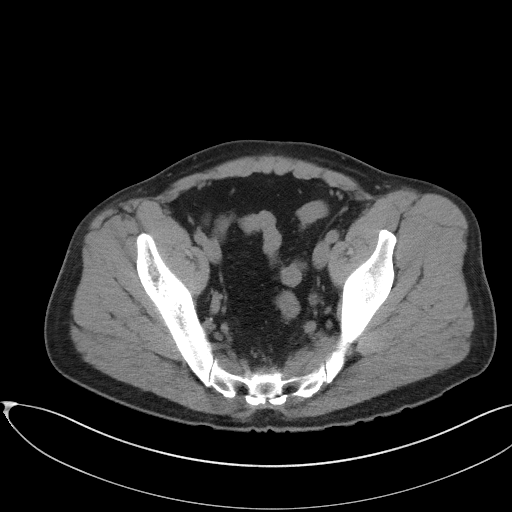
[im 34/98  soft-tissue]
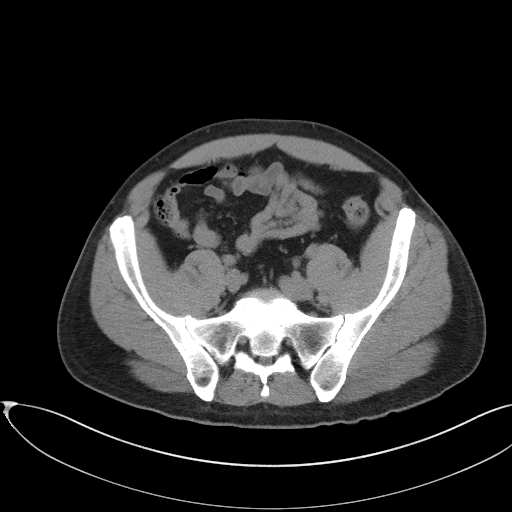
[im 43/98  soft-tissue]
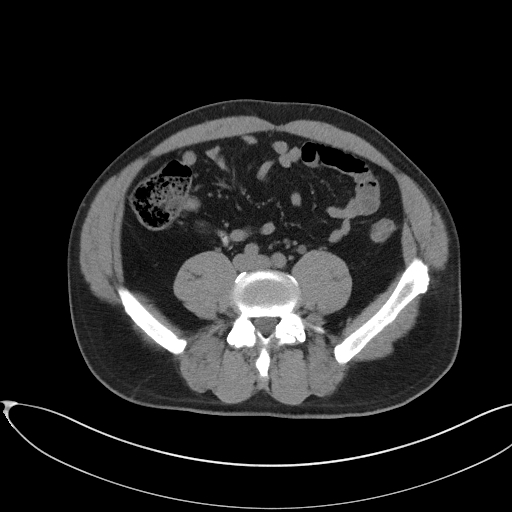
[im 51/98  soft-tissue]
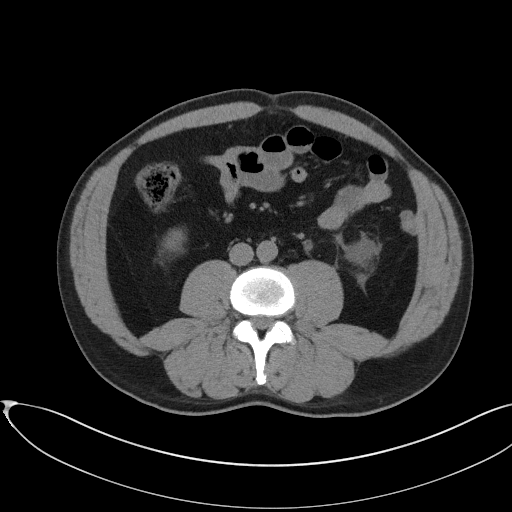
[im 55/98  soft-tissue]
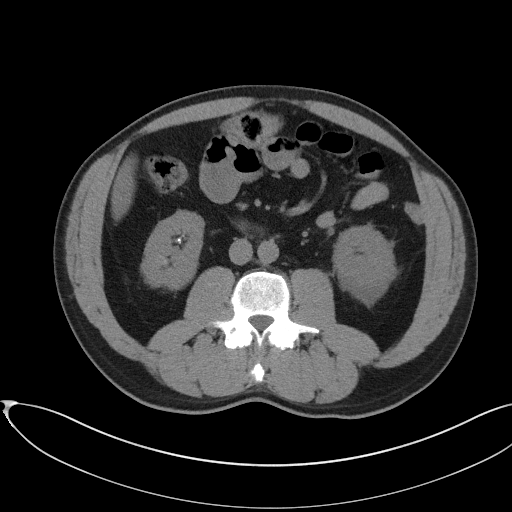
[im 64/98  soft-tissue]
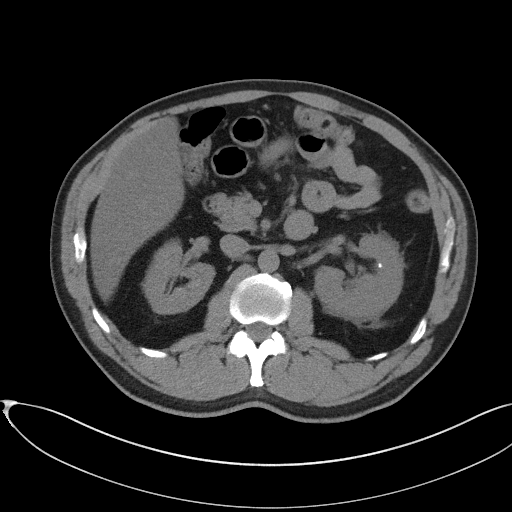
[im 64/98  bone]
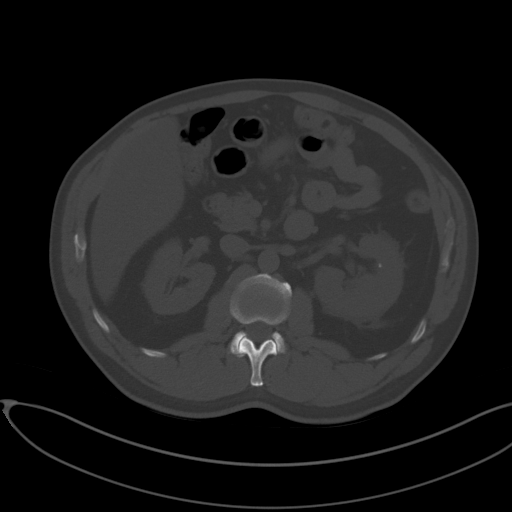
[im 72/98  soft-tissue]
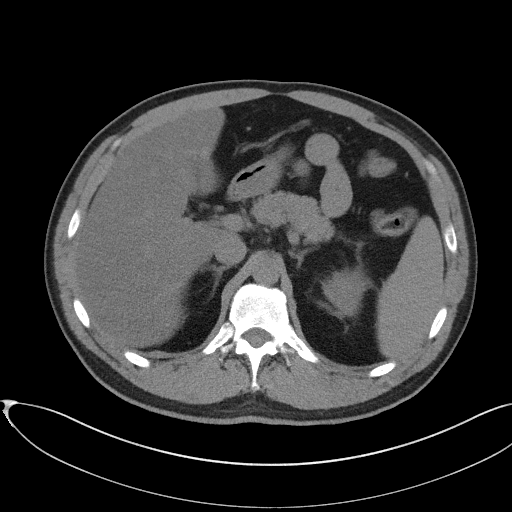
[im 76/98  soft-tissue]
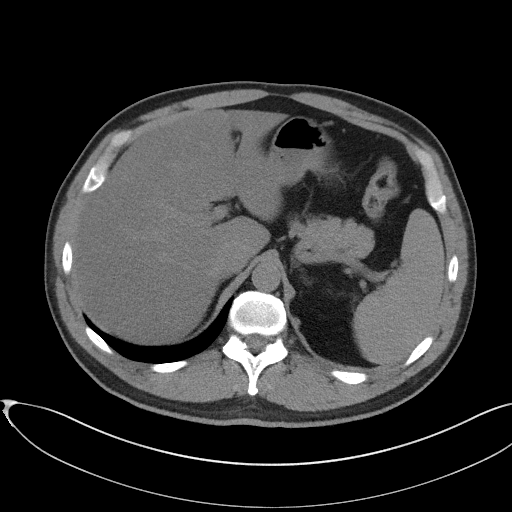
[im 85/98  soft-tissue]
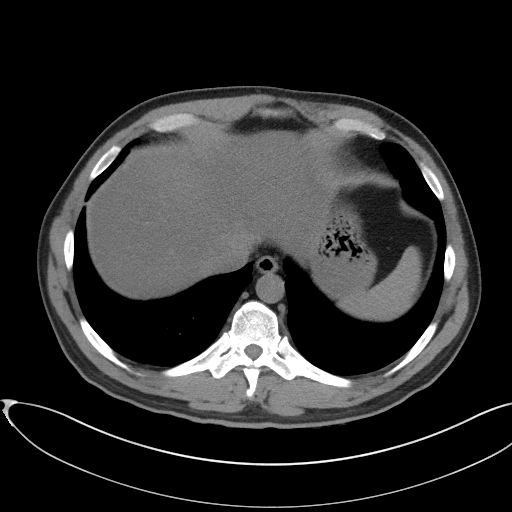
[im 93/98  soft-tissue]
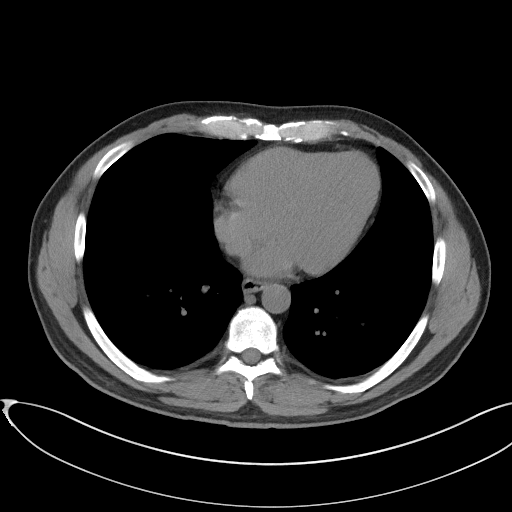

[Series 5: coronal · coronal · 0.73mm/px · 3 of 141 slices shown]
[im 47/141  soft-tissue]
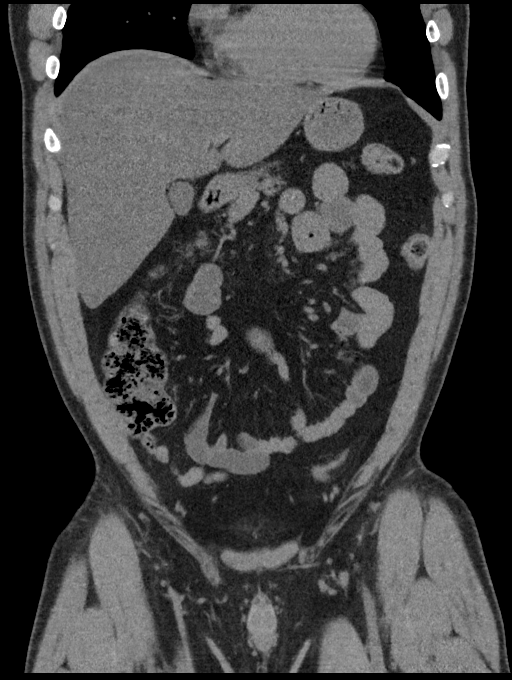
[im 63/141  soft-tissue]
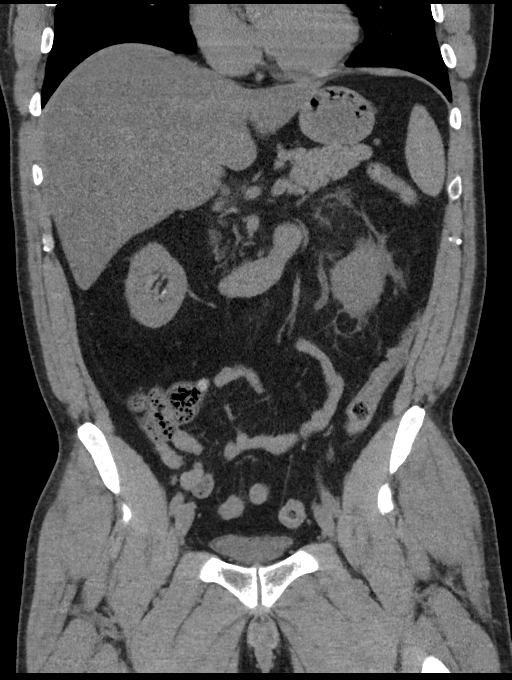
[im 78/141  soft-tissue]
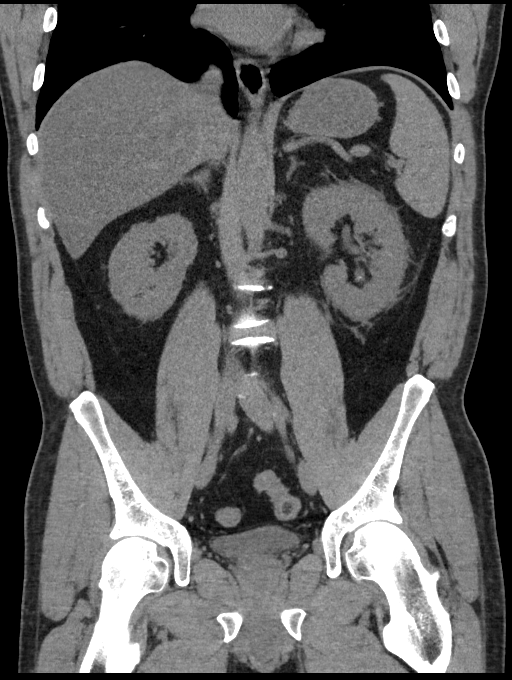

[16 of 46 positions shown; findings below may reference images not displayed]

FINDINGS: Lower chest: No acute abnormality.

Hepatobiliary: Fatty infiltration of the liver. Gallbladder is
unremarkable. No bile duct dilatation.

Pancreas: Unremarkable. No pancreatic ductal dilatation or
surrounding inflammatory changes.

Spleen: Normal in appearance.

Adrenals/Urinary Tract:

Adrenal glands are unremarkable.

3 mm stone within the distal left ureter causing moderate left-sided
hydronephrosis with associated perinephric edema.

At least 7 left renal stones, measuring 1-5 mm in size. At least 9
right renal stones, measuring 1-6 mm in size. No right-sided
hydronephrosis. No right-sided ureteral stone. Bladder is
decompressed.

Stomach/Bowel: Bowel is normal in caliber. No bowel wall thickening
or evidence of bowel wall inflammation seen. Appendix is normal.

Vascular/Lymphatic: No significant vascular findings are present. No
enlarged abdominal or pelvic lymph nodes.

Reproductive: Prostate is unremarkable.

Other: No free fluid or abscess collections seen. No free
intraperitoneal air.

Musculoskeletal: Chronic unilateral left pars interarticularis
defect at L5-S1 without associated spondylolisthesis. No acute or
suspicious osseous finding.
IMPRESSION: 1. 3 mm stone within the distal left ureter causing moderate
left-sided hydronephrosis with associated perinephric edema.
2. Bilateral nephrolithiasis.
3. Fatty infiltration of the liver.

## 2018-07-17 HISTORY — PX: COLONOSCOPY: SHX174

## 2020-06-15 ENCOUNTER — Other Ambulatory Visit: Payer: Self-pay

## 2020-06-15 ENCOUNTER — Encounter: Payer: Self-pay | Admitting: Emergency Medicine

## 2020-06-15 ENCOUNTER — Ambulatory Visit
Admission: EM | Admit: 2020-06-15 | Discharge: 2020-06-15 | Disposition: A | Discharge status: Home / Self Care | Payer: BC Managed Care – PPO | Attending: Physician Assistant | Admitting: Physician Assistant

## 2020-06-15 DIAGNOSIS — R310 Gross hematuria: Secondary | ICD-10-CM | POA: Diagnosis not present

## 2020-06-15 DIAGNOSIS — R300 Dysuria: Secondary | ICD-10-CM | POA: Diagnosis not present

## 2020-06-15 DIAGNOSIS — R509 Fever, unspecified: Secondary | ICD-10-CM | POA: Insufficient documentation

## 2020-06-15 DIAGNOSIS — R109 Unspecified abdominal pain: Secondary | ICD-10-CM | POA: Diagnosis not present

## 2020-06-15 DIAGNOSIS — M545 Low back pain, unspecified: Secondary | ICD-10-CM | POA: Insufficient documentation

## 2020-06-15 LAB — URINALYSIS, COMPLETE (UACMP) WITH MICROSCOPIC
Bacteria, UA: NONE SEEN
Bilirubin Urine: NEGATIVE
Glucose, UA: NEGATIVE mg/dL
Ketones, ur: NEGATIVE mg/dL
Nitrite: POSITIVE — AB
Protein, ur: >300 mg/dL — AB
RBC / HPF: >50 RBC/hpf (ref 0–5)
Specific Gravity, Urine: 1.025 (ref 1.005–1.030)
Squamous Epithelial / LPF: NONE SEEN (ref 0–5)
WBC, UA: >50 WBC/hpf (ref 0–5)
pH: 6 (ref 5.0–8.0)

## 2020-06-15 MED ORDER — ONDANSETRON 8 MG PO TBDP
8.0000 mg | ORAL_TABLET | Freq: Once | ORAL | Status: AC
  Administered 2020-06-15: 8 mg via ORAL

## 2020-06-15 MED ORDER — ACETAMINOPHEN 500 MG PO TABS
1000.0000 mg | ORAL_TABLET | Freq: Once | ORAL | Status: AC
  Administered 2020-06-15: 1000 mg via ORAL

## 2020-06-15 NOTE — Discharge Instructions (Addendum)
Please go directly to the emergency department for further treatment evaluation.

## 2020-06-15 NOTE — ED Notes (Signed)
Patient is being discharged from the Urgent Care and sent to the Emergency Department via private vehicle with his wife . Per Amador Cunas, PA, patient is in need of higher level of care due to vital signs and symptoms. Patient is aware and verbalizes understanding of plan of care.  Vitals:   06/15/20 1401  BP: 109/77  Pulse: (!) 105  Resp: 16  Temp: (!) 103.1 F (39.5 C)  SpO2: 98%

## 2020-06-15 NOTE — ED Triage Notes (Signed)
Patient c/o back pain that started on Friday.  Patient states that starting today he has been passing blood clots in his urine, having burning when urinating, lower abdominal pain and urinary frequency.  Patient has chills.

## 2020-06-15 NOTE — ED Provider Notes (Addendum)
[facility]    CSN: [id number] Arrival date & time: 06/15/20  1310      History   Chief Complaint Chief Complaint  Patient presents with  . Hematuria  . Dysuria  . Abdominal Pain    HPI Brandon Benitez is a 60 y.o. male presents to the urgent care facility for evaluation of back pain, abdominal pain, dysuria with hematuria along with fevers chills body aches and nausea. Symptoms began 2 days ago and have been increasing. He has a history of kidney stones, denies having similar pains at this time. He denies any trauma or injury. He is not on any blood thinners. He states 2 days ago he developed severe back pain that has progressed to dysuria with passing of large blood clots with abdominal pain. He has been having fevers and chills over the last 24 hours, has not taken any medications for symptoms.  HPI  Past Medical History:  Diagnosis Date  . Kidney stones   . Kidney stones     There are no problems to display for this patient.   History reviewed. No pertinent surgical history.     Home Medications    Prior to Admission medications   Medication Sig Start Date End Date Taking? Authorizing Provider  fenofibrate (TRICOR) 145 MG tablet Take 145 mg by mouth daily. 08/29/16  Yes [provider]  levothyroxine (SYNTHROID, LEVOTHROID) 125 MCG tablet Take 125 mcg by mouth daily. 08/29/16  Yes [provider]  simvastatin (ZOCOR) 40 MG tablet Take 40 mg by mouth daily. 09/08/16  Yes [provider]  omeprazole (PRILOSEC) 20 MG capsule Take 20 mg by mouth daily. 09/08/16   [provider]  ondansetron (ZOFRAN ODT) 4 MG disintegrating tablet Take 1 tablet (4 mg total) by mouth every 6 (six) hours as needed for nausea or vomiting. 10/16/16   Sharyn Creamer, MD  oxyCODONE (OXY IR/ROXICODONE) 5 MG immediate release tablet Take 1 tablet (5 mg total) by mouth every 6 (six) hours as needed for severe pain. 10/16/16   Sharyn Creamer, MD  tamsulosin  (FLOMAX) 0.4 MG CAPS capsule Take 1 capsule (0.4 mg total) by mouth daily. 10/16/16   Sharyn Creamer, MD    Family History History reviewed. No pertinent family history.  Social History Social History   Tobacco Use  . Smoking status: Never Smoker  . Smokeless tobacco: Never Used  Vaping Use  . Vaping Use: Never used  Substance Use Topics  . Alcohol use: No  . Drug use: Never     Allergies   Patient has no known allergies.   Review of Systems Review of Systems  Constitutional: Positive for chills, fatigue and fever.  HENT: Negative for congestion.   Respiratory: Negative for cough and shortness of breath.   Gastrointestinal: Positive for abdominal pain and nausea. Negative for diarrhea and vomiting.  Genitourinary: Positive for dysuria, hematuria and urgency.  Musculoskeletal: Positive for back pain.  Skin: Negative for rash and wound.  Neurological: Negative for dizziness and headaches.     Physical Exam Triage Vital Signs ED Triage Vitals  Enc Vitals Group     BP 06/15/20 1401 109/77     Pulse Rate 06/15/20 1401 (!) 105     Resp 06/15/20 1401 16     Temp 06/15/20 1401 (!) 103.1 F (39.5 C)     Temp Source 06/15/20 1401 Oral     SpO2 06/15/20 1401 98 %     Weight 06/15/20 1358 188 lb (  85.3 kg)     Height 06/15/20 1358 5\' 7"  (1.702 m)     Head Circumference --      Peak Flow --      Pain Score 06/15/20 1358 8     Pain Loc --      Pain Edu? --      Excl. in GC? --    No data found.  Updated Vital Signs BP 109/77 (BP Location: Left Arm)   Pulse (!) 105   Temp (!) 103.1 F (39.5 C) (Oral)   Resp 16   Ht 5\' 7"  (1.702 m)   Wt 188 lb (85.3 kg)   SpO2 98%   BMI 29.44 kg/m   Visual Acuity Right Eye Distance:   Left Eye Distance:   Bilateral Distance:    Right Eye Near:   Left Eye Near:    Bilateral Near:     Physical Exam Constitutional:      Appearance: He is well-developed and well-nourished. He is ill-appearing. He is not diaphoretic.  HENT:      Head: Normocephalic and atraumatic.  Eyes:     Conjunctiva/sclera: Conjunctivae normal.  Cardiovascular:     Rate and Rhythm: Regular rhythm. Tachycardia present.  Pulmonary:     Effort: Pulmonary effort is normal. No respiratory distress.  Abdominal:     General: Abdomen is flat. Bowel sounds are normal. There is no distension. There are no signs of injury.     Palpations: Abdomen is soft.     Tenderness: There is abdominal tenderness (mild) in the right lower quadrant, suprapubic area and left lower quadrant. There is right CVA tenderness and left CVA tenderness. There is no guarding or rebound.  Musculoskeletal:        General: Normal range of motion.     Cervical back: Normal range of motion.  Skin:    General: Skin is warm.     Findings: No rash.  Neurological:     General: No focal deficit present.     Mental Status: He is alert and oriented to person, place, and time.  Psychiatric:        Mood and Affect: Mood and affect and mood normal.        Behavior: Behavior normal.        Thought Content: Thought content normal.      UC Treatments / Results  Labs (all labs ordered are listed, but only abnormal results are displayed) Labs Reviewed  URINALYSIS, COMPLETE (UACMP) WITH MICROSCOPIC - Abnormal; Notable for the following components:      Result Value   APPearance CLOUDY (*)    Hgb urine dipstick LARGE (*)    Protein, ur >300 (*)    Nitrite POSITIVE (*)    Leukocytes,Ua SMALL (*)    All other components within normal limits    EKG   Radiology No results found.  Procedures Procedures (including critical care time)  Medications Ordered in UC Medications  acetaminophen (TYLENOL) tablet 1,000 mg (has no administration in time range)  ondansetron (ZOFRAN-ODT) disintegrating tablet 8 mg (8 mg Oral Given 06/15/20 1410)    Initial Impression / Assessment and Plan / UC Course  I have reviewed the triage vital signs and the nursing notes.  Pertinent labs &  imaging results that were available during my care of the patient were reviewed by me and considered in my medical decision making (see chart for details).     60 year old male with 2-day history of back, flank pain along  with lower abdominal discomfort, dysuria with hematuria. Upon arrival noted to be febrile and tachycardic. Due to level of pain and vital signs, recommend transferring directly to the emergency department. Wife will drive him immediately to emergency department. Discussed his need for work-up, IV fluids, antibiotics as well as CT scan which we do not have capability of. Patient is in agreement.  Patient is given Tylenol and Zofran ODT just prior to transfer Final Clinical Impressions(s) / UC Diagnoses   Final diagnoses:  Fever, unspecified  Dysuria  Gross hematuria  Abdominal pain, unspecified abdominal location  Acute midline low back pain without sciatica     Discharge Instructions     Please go directly to the emergency department for further treatment evaluation.   ED Prescriptions    None     PDMP not reviewed this encounter.   Evon Slack, PA-C 06/15/20 1419    Evon Slack, PA-C 06/15/20 1424

## 2022-04-29 ENCOUNTER — Other Ambulatory Visit: Payer: Self-pay | Admitting: Sports Medicine

## 2022-04-29 ENCOUNTER — Ambulatory Visit
Admission: RE | Admit: 2022-04-29 | Discharge: 2022-04-29 | Disposition: A | Discharge status: Home / Self Care | Payer: BC Managed Care – PPO | Source: Ambulatory Visit | Attending: Sports Medicine | Admitting: Sports Medicine

## 2022-04-29 DIAGNOSIS — S76112A Strain of left quadriceps muscle, fascia and tendon, initial encounter: Secondary | ICD-10-CM

## 2022-04-30 ENCOUNTER — Other Ambulatory Visit: Payer: Self-pay | Admitting: Orthopedic Surgery

## 2022-05-05 ENCOUNTER — Encounter
Admission: RE | Admit: 2022-05-05 | Discharge: 2022-05-05 | Disposition: A | Discharge status: Home / Self Care | Payer: BC Managed Care – PPO | Source: Ambulatory Visit | Attending: Orthopedic Surgery | Admitting: Orthopedic Surgery

## 2022-05-05 NOTE — Patient Instructions (Addendum)
Your procedure is scheduled on: Friday, December 15 Report to the Registration Desk on the 1st floor of the [facility] To find out your arrival time, please call [phone] between 1PM - 3PM on: Thursday, December 14 If your arrival time is 6:00 am, do not arrive prior to that time as the [facility] entrance doors do not open until 6:00 am.  REMEMBER: Instructions that are not followed completely may result in serious medical risk, up to and including death; or upon the discretion of your surgeon and anesthesiologist your surgery may need to be rescheduled.  Do not eat food after midnight the night before surgery.  No gum chewing, lozengers or hard candies.  You may however, drink CLEAR liquids up to 2 hours before you are scheduled to arrive for your surgery. Do not drink anything within 2 hours of your scheduled arrival time.  Clear liquids include: - water  - apple juice without pulp - gatorade (not RED colors) - black coffee or tea (Do NOT add milk or creamers to the coffee or tea) Do NOT drink anything that is not on this list.  TAKE THESE MEDICATIONS THE MORNING OF SURGERY WITH A SIP OF WATER:  Levothyroxine  One week prior to surgery:  Stop Anti-inflammatories (NSAIDS) such as Advil, Aleve, Ibuprofen, Motrin, Naproxen, Naprosyn and Aspirin based products such as Excedrin, Goodys Powder, BC Powder. Stop ANY OVER THE COUNTER supplements until after surgery. You may however, continue to take Tylenol if needed for pain up until the day of surgery.  No Alcohol for 24 hours before or after surgery.  No Smoking including e-cigarettes for 24 hours prior to surgery.  No chewable tobacco products for at least 6 hours prior to surgery.  No nicotine patches on the day of surgery.  Do not use any "recreational" drugs for at least a week prior to your surgery.  Please be advised that the combination of cocaine and anesthesia may have negative outcomes, up to and including  death. If you test positive for cocaine, your surgery will be cancelled.  On the morning of surgery brush your teeth with toothpaste and water, you may rinse your mouth with mouthwash if you wish. Do not swallow any toothpaste or mouthwash.  Use CHG Soap as directed on instruction sheet.  Do not wear jewelry, make-up, hairpins, clips or nail polish.  Do not wear lotions, powders, or perfumes.   Do not shave body from the neck down 48 hours prior to surgery just in case you cut yourself which could leave a site for infection.  Also, freshly shaved skin may become irritated if using the CHG soap.  Contact lenses, hearing aids and dentures may not be worn into surgery.  Do not bring valuables to the hospital. [facility] is not responsible for any missing/lost belongings or valuables.   Notify your doctor if there is any change in your medical condition (cold, fever, infection).  Wear comfortable clothing (specific to your surgery type) to the hospital.  After surgery, you can help prevent lung complications by doing breathing exercises.  Take deep breaths and cough every 1-2 hours. Your doctor may order a device called an Incentive Spirometer to help you take deep breaths.  If you are being discharged the day of surgery, you will not be allowed to drive home. You will need a responsible adult (18 years or older) to drive you home and stay with you that night.   If you are taking public transportation, you  will need to have a responsible adult (18 years or older) with you. Please confirm with your physician that it is acceptable to use public transportation.   Please call the Pre-admissions Testing Dept. at [phone] if you have any questions about these instructions.  Surgery Visitation Policy:  Patients undergoing a surgery or procedure may have two family members or support persons with them as long as the person is not COVID-19 positive or experiencing its symptoms.       Preparing for Surgery with CHLORHEXIDINE GLUCONATE (CHG) Soap  Chlorhexidine Gluconate (CHG) Soap  o An antiseptic cleaner that kills germs and bonds with the skin to continue killing germs even after washing  o Used for showering the night before surgery and morning of surgery  Before surgery, you can play an important role by reducing the number of germs on your skin.  CHG (Chlorhexidine gluconate) soap is an antiseptic cleanser which kills germs and bonds with the skin to continue killing germs even after washing.  Please do not use if you have an allergy to CHG or antibacterial soaps. If your skin becomes reddened/irritated stop using the CHG.  1. Shower the NIGHT BEFORE SURGERY and the MORNING OF SURGERY with CHG soap.  2. If you choose to wash your hair, wash your hair first as usual with your normal shampoo.  3. After shampooing, rinse your hair and body thoroughly to remove the shampoo.  4. Use CHG as you would any other liquid soap. You can apply CHG directly to the skin and wash gently with a scrungie or a clean washcloth.  5. Apply the CHG soap to your body only from the neck down. Do not use on open wounds or open sores. Avoid contact with your eyes, ears, mouth, and genitals (private parts). Wash face and genitals (private parts) with your normal soap.  6. Wash thoroughly, paying special attention to the area where your surgery will be performed.  7. Thoroughly rinse your body with warm water.  8. Do not shower/wash with your normal soap after using and rinsing off the CHG soap.  9. Pat yourself dry with a clean towel.  10. Wear clean pajamas to bed the night before surgery.  12. Place clean sheets on your bed the night of your first shower and do not sleep with pets.  13. Shower again with the CHG soap on the day of surgery prior to arriving at the hospital.  14. Do not apply any deodorants/lotions/powders.  15. Please wear clean clothes to the  hospital.

## 2022-05-07 ENCOUNTER — Other Ambulatory Visit: Payer: Self-pay

## 2022-05-07 ENCOUNTER — Encounter: Payer: Self-pay | Admitting: Orthopedic Surgery

## 2022-05-07 ENCOUNTER — Ambulatory Visit: Payer: BC Managed Care – PPO | Admitting: General Practice

## 2022-05-07 ENCOUNTER — Encounter
Admission: RE | Disposition: A | Discharge status: Home / Self Care | Payer: Self-pay | Source: Home / Self Care | Attending: Orthopedic Surgery

## 2022-05-07 ENCOUNTER — Ambulatory Visit
Admission: RE | Admit: 2022-05-07 | Discharge: 2022-05-07 | Disposition: A | Discharge status: Home / Self Care | Payer: BC Managed Care – PPO | Attending: Orthopedic Surgery | Admitting: Orthopedic Surgery

## 2022-05-07 DIAGNOSIS — S76112A Strain of left quadriceps muscle, fascia and tendon, initial encounter: Secondary | ICD-10-CM | POA: Diagnosis not present

## 2022-05-07 DIAGNOSIS — Y9339 Activity, other involving climbing, rappelling and jumping off: Secondary | ICD-10-CM | POA: Insufficient documentation

## 2022-05-07 DIAGNOSIS — M1712 Unilateral primary osteoarthritis, left knee: Secondary | ICD-10-CM | POA: Diagnosis not present

## 2022-05-07 DIAGNOSIS — E039 Hypothyroidism, unspecified: Secondary | ICD-10-CM | POA: Insufficient documentation

## 2022-05-07 HISTORY — PX: QUADRICEPS TENDON REPAIR: SHX756

## 2022-05-07 SURGERY — REPAIR, TENDON, QUADRICEPS
Anesthesia: General | Site: Knee | Laterality: Left

## 2022-05-07 MED ORDER — FENTANYL CITRATE (PF) 100 MCG/2ML IJ SOLN
INTRAMUSCULAR | Status: DC | PRN
  Administered 2022-05-07 (×4): 25 ug via INTRAVENOUS

## 2022-05-07 MED ORDER — KETAMINE HCL 50 MG/5ML IJ SOSY
PREFILLED_SYRINGE | INTRAMUSCULAR | Status: AC
  Filled 2022-05-07: qty 5

## 2022-05-07 MED ORDER — PROPOFOL 10 MG/ML IV BOLUS
INTRAVENOUS | Status: DC | PRN
  Administered 2022-05-07: 150 mg via INTRAVENOUS

## 2022-05-07 MED ORDER — ACETAMINOPHEN 500 MG PO TABS: 500.0000 mg | ORAL_TABLET | Freq: Four times a day (QID) | ORAL | Status: DC

## 2022-05-07 MED ORDER — ONDANSETRON HCL 4 MG/2ML IJ SOLN: 4.0000 mg | Freq: Four times a day (QID) | INTRAMUSCULAR | Status: DC | PRN

## 2022-05-07 MED ORDER — MORPHINE SULFATE (PF) 2 MG/ML IV SOLN: 0.5000 mg | INTRAVENOUS | Status: DC | PRN

## 2022-05-07 MED ORDER — KETAMINE HCL 10 MG/ML IJ SOLN
INTRAMUSCULAR | Status: DC | PRN
  Administered 2022-05-07: 20 mg via INTRAVENOUS
  Administered 2022-05-07: 10 mg via INTRAVENOUS
  Administered 2022-05-07: 20 mg via INTRAVENOUS

## 2022-05-07 MED ORDER — MIDAZOLAM HCL 2 MG/2ML IJ SOLN
INTRAMUSCULAR | Status: DC | PRN
  Administered 2022-05-07: 2 mg via INTRAVENOUS

## 2022-05-07 MED ORDER — HYDROCODONE-ACETAMINOPHEN 5-325 MG PO TABS: 1.0000 | ORAL_TABLET | ORAL | Status: DC | PRN

## 2022-05-07 MED ORDER — 0.9 % SODIUM CHLORIDE (POUR BTL) OPTIME
TOPICAL | Status: DC | PRN
  Administered 2022-05-07: 500 mL

## 2022-05-07 MED ORDER — CEFAZOLIN SODIUM-DEXTROSE 2-4 GM/100ML-% IV SOLN
INTRAVENOUS | Status: AC
  Filled 2022-05-07: qty 100

## 2022-05-07 MED ORDER — FAMOTIDINE 20 MG PO TABS
ORAL_TABLET | ORAL | Status: AC
  Administered 2022-05-07: 20 mg via ORAL
  Filled 2022-05-07: qty 1

## 2022-05-07 MED ORDER — METHOCARBAMOL 500 MG PO TABS
ORAL_TABLET | ORAL | Status: AC
  Filled 2022-05-07: qty 1

## 2022-05-07 MED ORDER — DEXAMETHASONE SODIUM PHOSPHATE 10 MG/ML IJ SOLN
INTRAMUSCULAR | Status: DC | PRN
  Administered 2022-05-07: 5 mg via INTRAVENOUS

## 2022-05-07 MED ORDER — CELECOXIB 100 MG PO CAPS: 100.0000 mg | ORAL_CAPSULE | Freq: Two times a day (BID) | ORAL | 0 refills | Status: AC

## 2022-05-07 MED ORDER — ACETAMINOPHEN 10 MG/ML IV SOLN
INTRAVENOUS | Status: DC | PRN
  Administered 2022-05-07: 1000 mg via INTRAVENOUS

## 2022-05-07 MED ORDER — FENTANYL CITRATE (PF) 100 MCG/2ML IJ SOLN
INTRAMUSCULAR | Status: AC
  Filled 2022-05-07: qty 2

## 2022-05-07 MED ORDER — MIDAZOLAM HCL 2 MG/2ML IJ SOLN
INTRAMUSCULAR | Status: AC
  Filled 2022-05-07: qty 2

## 2022-05-07 MED ORDER — FENTANYL CITRATE (PF) 100 MCG/2ML IJ SOLN
INTRAMUSCULAR | Status: AC
  Administered 2022-05-07: 25 ug via INTRAVENOUS
  Filled 2022-05-07: qty 2

## 2022-05-07 MED ORDER — LACTATED RINGERS IV SOLN: INTRAVENOUS | Status: DC

## 2022-05-07 MED ORDER — ACETAMINOPHEN 500 MG PO TABS: 1000.0000 mg | ORAL_TABLET | Freq: Three times a day (TID) | ORAL | 0 refills | Status: AC | PRN

## 2022-05-07 MED ORDER — ASPIRIN 325 MG PO TBEC: 325.0000 mg | DELAYED_RELEASE_TABLET | Freq: Two times a day (BID) | ORAL | 0 refills | Status: AC

## 2022-05-07 MED ORDER — ONDANSETRON HCL 4 MG PO TABS: 4.0000 mg | ORAL_TABLET | Freq: Four times a day (QID) | ORAL | Status: DC | PRN

## 2022-05-07 MED ORDER — ACETAMINOPHEN 10 MG/ML IV SOLN
INTRAVENOUS | Status: AC
  Filled 2022-05-07: qty 100

## 2022-05-07 MED ORDER — ORAL CARE MOUTH RINSE: 15.0000 mL | Freq: Once | OROMUCOSAL | Status: AC

## 2022-05-07 MED ORDER — CHLORHEXIDINE GLUCONATE 0.12 % MT SOLN: 15.0000 mL | Freq: Once | OROMUCOSAL | Status: AC

## 2022-05-07 MED ORDER — PHENYLEPHRINE 80 MCG/ML (10ML) SYRINGE FOR IV PUSH (FOR BLOOD PRESSURE SUPPORT)
PREFILLED_SYRINGE | INTRAVENOUS | Status: AC
  Filled 2022-05-07: qty 10

## 2022-05-07 MED ORDER — ACETAMINOPHEN 325 MG PO TABS: 325.0000 mg | ORAL_TABLET | Freq: Four times a day (QID) | ORAL | Status: DC | PRN

## 2022-05-07 MED ORDER — BUPIVACAINE-EPINEPHRINE (PF) 0.5% -1:200000 IJ SOLN
INTRAMUSCULAR | Status: AC
  Filled 2022-05-07: qty 30

## 2022-05-07 MED ORDER — HYDROCODONE-ACETAMINOPHEN 7.5-325 MG PO TABS: 1.0000 | ORAL_TABLET | ORAL | Status: DC | PRN

## 2022-05-07 MED ORDER — PHENYLEPHRINE 80 MCG/ML (10ML) SYRINGE FOR IV PUSH (FOR BLOOD PRESSURE SUPPORT)
PREFILLED_SYRINGE | INTRAVENOUS | Status: DC | PRN
  Administered 2022-05-07: 80 ug via INTRAVENOUS

## 2022-05-07 MED ORDER — CEFAZOLIN SODIUM-DEXTROSE 2-4 GM/100ML-% IV SOLN
2.0000 g | INTRAVENOUS | Status: AC
  Administered 2022-05-07: 2 g via INTRAVENOUS

## 2022-05-07 MED ORDER — FENTANYL CITRATE (PF) 100 MCG/2ML IJ SOLN
25.0000 ug | INTRAMUSCULAR | Status: DC | PRN
  Administered 2022-05-07 (×2): 25 ug via INTRAVENOUS
  Administered 2022-05-07: 50 ug via INTRAVENOUS

## 2022-05-07 MED ORDER — BUPIVACAINE-EPINEPHRINE 0.25% -1:200000 IJ SOLN
INTRAMUSCULAR | Status: DC | PRN
  Administered 2022-05-07: 40 mL via INTRAMUSCULAR

## 2022-05-07 MED ORDER — LIDOCAINE HCL (CARDIAC) PF 100 MG/5ML IV SOSY
PREFILLED_SYRINGE | INTRAVENOUS | Status: DC | PRN
  Administered 2022-05-07: 100 mg via INTRAVENOUS

## 2022-05-07 MED ORDER — ONDANSETRON HCL 4 MG/2ML IJ SOLN
INTRAMUSCULAR | Status: DC | PRN
  Administered 2022-05-07: 4 mg via INTRAVENOUS

## 2022-05-07 MED ORDER — OXYCODONE HCL 5 MG PO TABS
ORAL_TABLET | ORAL | Status: AC
  Administered 2022-05-07: 5 mg via ORAL
  Filled 2022-05-07: qty 1

## 2022-05-07 MED ORDER — OXYCODONE HCL 5 MG PO TABS: 5.0000 mg | ORAL_TABLET | Freq: Once | ORAL | Status: AC | PRN

## 2022-05-07 MED ORDER — METHOCARBAMOL 750 MG PO TABS
750.0000 mg | ORAL_TABLET | ORAL | Status: AC
  Administered 2022-05-07: 750 mg via ORAL

## 2022-05-07 MED ORDER — METHOCARBAMOL 750 MG PO TABS
ORAL_TABLET | ORAL | Status: AC
  Filled 2022-05-07: qty 1

## 2022-05-07 MED ORDER — KETOROLAC TROMETHAMINE 30 MG/ML IJ SOLN
INTRAMUSCULAR | Status: AC
  Filled 2022-05-07: qty 1

## 2022-05-07 MED ORDER — KETOROLAC TROMETHAMINE 30 MG/ML IJ SOLN
INTRAMUSCULAR | Status: DC | PRN
  Administered 2022-05-07: 30 mg via INTRAVENOUS

## 2022-05-07 MED ORDER — OXYCODONE HCL 5 MG/5ML PO SOLN: 5.0000 mg | Freq: Once | ORAL | Status: AC | PRN

## 2022-05-07 MED ORDER — BUPIVACAINE LIPOSOME 1.3 % IJ SUSP
INTRAMUSCULAR | Status: AC
  Filled 2022-05-07: qty 20

## 2022-05-07 MED ORDER — FAMOTIDINE 20 MG PO TABS: 20.0000 mg | ORAL_TABLET | Freq: Once | ORAL | Status: AC

## 2022-05-07 MED ORDER — TRAMADOL HCL 50 MG PO TABS: 50.0000 mg | ORAL_TABLET | Freq: Four times a day (QID) | ORAL | 0 refills | Status: AC | PRN

## 2022-05-07 MED ORDER — BUPIVACAINE-EPINEPHRINE (PF) 0.25% -1:200000 IJ SOLN
INTRAMUSCULAR | Status: AC
  Filled 2022-05-07: qty 30

## 2022-05-07 MED ORDER — CHLORHEXIDINE GLUCONATE 0.12 % MT SOLN
OROMUCOSAL | Status: AC
  Administered 2022-05-07: 15 mL via OROMUCOSAL
  Filled 2022-05-07: qty 15

## 2022-05-07 SURGICAL SUPPLY — 43 items
CHLORAPREP W/TINT 26 (MISCELLANEOUS) ×1 IMPLANT
CUFF TOURN SGL QUICK 24 (TOURNIQUET CUFF)
CUFF TOURN SGL QUICK 34 (TOURNIQUET CUFF)
CUFF TRNQT CYL 24X4X16.5-23 (TOURNIQUET CUFF) IMPLANT
CUFF TRNQT CYL 34X4.125X (TOURNIQUET CUFF) IMPLANT
DERMABOND ADVANCED .7 DNX12 (GAUZE/BANDAGES/DRESSINGS) IMPLANT
ELECT REM PT RETURN 9FT ADLT (ELECTROSURGICAL) ×1
ELECTRODE REM PT RTRN 9FT ADLT (ELECTROSURGICAL) ×1 IMPLANT
GAUZE SPONGE 4X4 12PLY STRL (GAUZE/BANDAGES/DRESSINGS) ×1 IMPLANT
GAUZE XEROFORM 1X8 LF (GAUZE/BANDAGES/DRESSINGS) ×2 IMPLANT
GLOVE BIO SURGEON STRL SZ8 (GLOVE) ×2 IMPLANT
GLOVE BIOGEL PI IND STRL 8 (GLOVE) ×1 IMPLANT
GLOVE PI ORTHO PRO STRL 7.5 (GLOVE) ×1 IMPLANT
GLOVE PI ORTHO PRO STRL SZ8 (GLOVE) ×2 IMPLANT
GOWN STRL REUS W/ TWL LRG LVL3 (GOWN DISPOSABLE) ×1 IMPLANT
GOWN STRL REUS W/ TWL XL LVL3 (GOWN DISPOSABLE) ×1 IMPLANT
GOWN STRL REUS W/TWL LRG LVL3 (GOWN DISPOSABLE) ×1
GOWN STRL REUS W/TWL XL LVL3 (GOWN DISPOSABLE) ×1
IMMOB KNEE 24 THIGH 24 443303 (SOFTGOODS) IMPLANT
KIT TURNOVER KIT A (KITS) ×1 IMPLANT
MANIFOLD NEPTUNE II (INSTRUMENTS) ×1 IMPLANT
NDL MAYO CATGUT SZ 2 (NEEDLE) ×1 IMPLANT
NEEDLE MAYO CATGUT SZ 1.5 (NEEDLE)
NEEDLE MAYO CATGUT SZ 2 (NEEDLE) IMPLANT
NS IRRIG 1000ML POUR BTL (IV SOLUTION) ×1 IMPLANT
NS IRRIG 500ML POUR BTL (IV SOLUTION) IMPLANT
PACK TOTAL KNEE (MISCELLANEOUS) ×1 IMPLANT
PAD ABD DERMACEA PRESS 5X9 (GAUZE/BANDAGES/DRESSINGS) ×2 IMPLANT
PASSER SUT SWANSON 36MM LOOP (INSTRUMENTS) IMPLANT
PENCIL SMOKE EVACUATOR (MISCELLANEOUS) IMPLANT
RETRIEVER SUT HEWSON (MISCELLANEOUS) IMPLANT
SCALPEL PROTECTED #10 DISP (BLADE) ×2 IMPLANT
STAPLER SKIN PROX 35W (STAPLE) ×1 IMPLANT
SUT DVC 2 QUILL PDO  T11 36X36 (SUTURE) ×1
SUT DVC 2 QUILL PDO T11 36X36 (SUTURE) IMPLANT
SUT ETHIBOND #5 BRAIDED 30INL (SUTURE) ×1 IMPLANT
SUT QUILL MONODERM 3-0 PS-2 (SUTURE) IMPLANT
SUT VIC AB 0 CT1 36 (SUTURE) IMPLANT
SUT VIC AB 1 CT1 36 (SUTURE) IMPLANT
SUT VIC AB 2-0 CT2 27 (SUTURE) IMPLANT
SYR 20ML LL LF (SYRINGE) IMPLANT
TRAP FLUID SMOKE EVACUATOR (MISCELLANEOUS) ×1 IMPLANT
WATER STERILE IRR 500ML POUR (IV SOLUTION) ×1 IMPLANT

## 2022-05-07 NOTE — H&P (Signed)
History of Present Illness: The patient is an 61 y.o. male seen in clinic today for evaluation of his left knee.  The patient reports he jumped on 04/26/2022 and when he landed his knee buckled and the next he knew he was on the ground.  He denies any head strike or other injury.  He was seen in the clinic with my partner Dr. Landry Mellow and evaluated with ultrasound and found to have a likely tear of his quadriceps tendon.  He was sent for an MRI on 04/29/2022 and found to have a quadriceps tear.  He reports he has been ambulating with a knee immobilizer and that his knee feels okay when he keeps it straight.  He has any pain with any flexion of the knee described as radiating from his knee up his thigh with any motions.  He has been avoiding any pain medications as he does not like to take them.  The patient denies fevers, chills, numbness, tingling, shortness of breath, chest pain, recent illness, or any other trauma.   Past Medical History:     Past Medical History:  Diagnosis Date   Acquired hypothyroidism 05/15/2014   Familial hyperlipidemia     Personal history of colonic polyps      +TA/REPEAT 345YRS/[place].Jola [facility]      Past Surgical History:      Past Surgical History:  Procedure Laterality Date   COLONOSCOPY   10/26/2013    Dr. Demetrius Charity. [place] @ [facility] - Adenomatous Polyps, REPEAT 345YRS/[place].Jola Baptist   COLONOSCOPY   07/17/2018    Normal colon/PHx CP/Repeat 33yrs/[facility]   LEFT FOOT HALLUX VALGUS CORRECTION       TONSILLECTOMY AND ADENOIDECTOMY          Past Family History: Family History       Family History  Problem Relation Age of Onset   Heart disease Father          CABG       Medications:       Current Outpatient Medications Ordered in Epic  Medication Sig Dispense Refill   fenofibrate 160 MG tablet TAKE 1 TABLET BY MOUTH ONCE DAILY. 90 tablet 3   levothyroxine (SYNTHROID) 125 MCG tablet TAKE 1 TABLET BY MOUTH ONCE DAILY. 90 tablet 3   simvastatin (ZOCOR) 40 MG tablet Take 1 tablet (40 mg  total) by mouth at bedtime 90 tablet 3    No current Epic-ordered facility-administered medications on file.      Allergies:     Allergies  Allergen Reactions   Smallpox Vaccines Nausea      Review of Systems:  A comprehensive 14 point ROS was performed, reviewed, and the pertinent orthopaedic findings are documented in the HPI.   Physical Exam: General/Constitutional: No apparent distress: well-nourished and well developed. Eyes: Pupils equal, round with synchronous movement. Respiratory: "Non-labored breathing. Cardiac:  Heart rate is regular. Integumentary: No impressive skin lesions present, except as noted in detailed exam. Neuro/Psych: Normal mood and affect, oriented to person, place and time.   Comprehensive Knee Exam: Gait Non-antalgic and fluid   Alignment Neutral    Inspection   Left  Skin Ecchymosis in various stages across the medial anterior and lateral quadriceps.  Soft Tissue Swelling noted to the entire knee  Quad Atrophy None    Palpation    Left  Tenderness Exquisitely tender over the superior pole of the patella and distal quadriceps muscle.  Palpable defect over the superior pole of the patella  Crepitus No patellofemoral or tibiofemoral crepitus  Effusion None    Range of Motion   Left  Flexion  0-45 with further flexion stopped due to pain  Extension  Able to hold full extension when passively moved significant pain with any active extension.    Ligamentous Exam   Left  Lachman Normal  Valgus 0 Normal  Valgus 30 Normal  Varus 0 Normal  Varus 30 Normal  Anterior Drawer In 30 degrees of flexion normal  Posterior Drawer Deferred    Neurovascular   Left  Quadriceps Strength 5/5  Hamstring Strength 5/5  Hip Abductor Strength 4/5  Distal Motor Normal  Distal Sensory Normal light touch sensation  Distal Pulses Normal      Imaging Studies: I reviewed AP lateral and sunrise x-rays of the left knee taken by another provider on 04/28/2022  showing some mild medial joint space narrowing patella appears midline with a small effusion.  Some small calcifications noted within the joint space.  No obvious fractures or dislocations noted.   MRI of the left knee images and report reviewed by myself performed on 04/29/2022 show a high-grade partial if not complete tear of the quadriceps tendon off the superior pole of the patella.  There is also a full-thickness cartilage defect of the patellar apex which is noted along with some mild partial thickness cartilage loss in the medial compartment.  No fractures or dislocations noted mild arthritis with a high-grade quad tear.   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  He is able to hold a straight leg raise as there are a few fibers still in continuity and his retinaculum appears to be in continuity on the MRI.  Given the large amount of tear and palpable defect I had a long conversation with the patient with regards to nonoperative versus operative intervention for this injury.  After discussing the risks and benefits the patient would like to proceed with a open left quadriceps tendon repair.  We discussed the use of suture through tunnel versus suture anchors and we will make a decision on the best method for repair at the time of surgery.  The approach and exposure was discussed.    The hospitalization and post-operative care and rehabilitation were also discussed. The use of perioperative antibiotics and DVT prophylaxis were discussed. The risk, benefits and alternatives to a surgical intervention were discussed at length with the patient. A lengthy discussion took place to review the most common complications including but not limited to: stiffness, loss of function, complex regional pain syndrome, deep vein thrombosis, pulmonary embolus,  heart attack, stroke, infection, wound breakdown, numbness, damage to nerves, tendon,muscles, arteries or other blood vessels, death and other possible complications from anesthesia. The patient was told that we will take steps to minimize these risks by using sterile technique, antibiotics and DVT prophylaxis when appropriate and follow the patient postoperatively in the office setting to monitor progress. The possibility of recurrent pain, no improvement in pain and actual worsening of pain were also discussed with the patient.    The discharge plan of care focused on the patient going home following surgery. The patient was encouraged to make the necessary arrangements to have someone stay with them when they are discharged home.    The benefits of surgery were discussed with the patient including the potential for improving the patient's current clinical condition through operative  intervention. Alternatives to surgical intervention including continued conservative management were also discussed in detail. All questions were answered to the satisfaction of the patient. The patient participated and agreed to the plan of care. An information packet was given to the patient to review prior to surgery.    All questions answered and the patient agrees with the plan to proceed with surgery.History and physical updated on the day of surgery.      Portions of this record have been created using Scientist, clinical (histocompatibility and immunogenetics).  Dictation errors have been sought, but may not have been identified and corrected.   Reinaldo Berber MD

## 2022-05-07 NOTE — Anesthesia Preprocedure Evaluation (Signed)
Anesthesia Evaluation  Patient identified by MRN, date of birth, ID band Patient awake    Reviewed: Allergy & Precautions, NPO status , Patient's Chart, lab work & pertinent test results  History of Anesthesia Complications Negative for: history of anesthetic complications  Airway Mallampati: III  TM Distance: >3 FB Neck ROM: full    Dental  (+) Chipped   Pulmonary neg pulmonary ROS   Pulmonary exam normal        Cardiovascular negative cardio ROS Normal cardiovascular exam     Neuro/Psych negative neurological ROS  negative psych ROS   GI/Hepatic negative GI ROS, Neg liver ROS,,,  Endo/Other  Hypothyroidism    Renal/GU      Musculoskeletal   Abdominal   Peds  Hematology negative hematology ROS (+)   Anesthesia Other Findings Past Medical History: No date: Colon polyps No date: History of kidney stones No date: Hyperlipidemia No date: Hypothyroidism No date: Pre-diabetes  Past Surgical History: 10/26/2013: COLONOSCOPY 07/17/2018: COLONOSCOPY No date: HALLUX VALGUS CORRECTION; Bilateral No date: TONSILLECTOMY AND ADENOIDECTOMY  BMI    Body Mass Index: 29.44 kg/m      Reproductive/Obstetrics negative OB ROS                             Anesthesia Physical Anesthesia Plan  ASA: 2  Anesthesia Plan:    Post-op Pain Management:    Induction: Intravenous  PONV Risk Score and Plan: 2 and Ondansetron, Dexamethasone and Midazolam  Airway Management Planned: LMA  Additional Equipment:   Intra-op Plan:   Post-operative Plan: Extubation in OR  Informed Consent: I have reviewed the patients History and Physical, chart, labs and discussed the procedure including the risks, benefits and alternatives for the proposed anesthesia with the patient or authorized representative who has indicated his/her understanding and acceptance.     Dental Advisory Given  Plan Discussed  with: Anesthesiologist, CRNA and Surgeon  Anesthesia Plan Comments: (Patient consented for risks of anesthesia including but not limited to:  - adverse reactions to medications - damage to eyes, teeth, lips or other oral mucosa - nerve damage due to positioning  - sore throat or hoarseness - Damage to heart, brain, nerves, lungs, other parts of body or loss of life  Patient voiced understanding.)       Anesthesia Quick Evaluation

## 2022-05-07 NOTE — Interval H&P Note (Signed)
History and Physical Interval Note:  05/07/2022 2:35 PM  Brandon Benitez  has presented today for surgery, with the diagnosis of Injury of quadriceps tendon S76.109A.  The various methods of treatment have been discussed with the patient and family. After consideration of risks, benefits and other options for treatment, the patient has consented to  Procedure(s): REPAIR QUADRICEP TENDON (Left) as a surgical intervention.  The patient's history has been reviewed, patient examined, no change in status, stable for surgery.  I have reviewed the patient's chart and labs.  Questions were answered to the patient's satisfaction.     Reinaldo Berber

## 2022-05-07 NOTE — Discharge Instructions (Addendum)
Instructions after quadriceps tendon repair  Reinaldo Berber M.D.     Dept. of Orthopaedics & Sports Medicine  [facility]  [address]  [place], [place]  [address]  Phone: [phone]   Fax: [phone]    DIET: Drink plenty of non-alcoholic fluids. Resume your normal diet. Include foods high in fiber.  ACTIVITY:  You can weight-bear as tolerated on your left lower extremity.  Use crutches or a walker as needed.  Knee immobilizer needs to be on at all times while ambulating/sleeping Keep your left knee straight at all times. You may open the top of the knee immobilizer and apply ice to the knee 20 minutes every hour.  Make sure that knee is staying straight at all times.   WOUND CARE:  You may begin showering 3 days after surgery with honeycomb dressing. Remove honeycomb dressing 7 days after surgery and continue showering. Allow dermabond to fall off on its own.   MEDICATIONS: You may resume your regular medications. Please take the pain medication as prescribed on the medication. Do not take pain medication on an empty stomach. You have been given a prescription for a blood thinner (aspirin 325 mg). Please take the medication as instructed x 4 weeks.  Do not drive or drink alcoholic beverages when taking pain medications.  POSTOPERATIVE CONSTIPATION PROTOCOL Constipation - defined medically as fewer than three stools per week and severe constipation as less than one stool per week.  One of the most common issues patients have following surgery is constipation.  Even if you have a regular bowel pattern at home, your normal regimen is likely to be disrupted due to multiple reasons following surgery.  Combination of anesthesia, postoperative narcotics, change in appetite and fluid intake all can affect your bowels.  In order to avoid complications following surgery, here are some recommendations in order to help you during your recovery period.  Colace (docusate) -  Pick up an over-the-counter form of Colace or another stool softener and take twice a day as long as you are requiring postoperative pain medications.  Take with a full glass of water daily.  If you experience loose stools or diarrhea, hold the colace until you stool forms back up.  If your symptoms do not get better within 1 week or if they get worse, check with your doctor.  Dulcolax (bisacodyl) - Pick up over-the-counter and take as directed by the product packaging as needed to assist with the movement of your bowels.  Take with a full glass of water.  Use this product as needed if not relieved by Colace only.   MiraLax (polyethylene glycol) - Pick up over-the-counter to have on hand.  MiraLax is a solution that will increase the amount of water in your bowels to assist with bowel movements.  Take as directed and can mix with a glass of water, juice, soda, coffee, or tea.  Take if you go more than two days without a movement. Do not use MiraLax more than once per day. Call your doctor if you are still constipated or irregular after using this medication for 7 days in a row.  If you continue to have problems with postoperative constipation, please contact the office for further assistance and recommendations.  If you experience "the worst abdominal pain ever" or develop nausea or vomiting, please contact the office immediatly for further recommendations for treatment.   CALL THE OFFICE FOR: Temperature above 101 degrees Excessive bleeding or drainage on the dressing. Excessive swelling,  coldness, or paleness of the toes. Persistent nausea and vomiting.  FOLLOW-UP:  You should have an appointment to return to the office in 14 days after surgery. Arrangements have been made for continuation of Physical Therapy (either home therapy or outpatient therapy). AMBULATORY SURGERY  DISCHARGE INSTRUCTIONS   The drugs that you were given will stay in your system until tomorrow so for the next 24 hours  you should not:  Drive an automobile Make any legal decisions Drink any alcoholic beverage   You may resume regular meals tomorrow.  Today it is better to start with liquids and gradually work up to solid foods.  You may eat anything you prefer, but it is better to start with liquids, then soup and crackers, and gradually work up to solid foods.   Please notify your doctor immediately if you have any unusual bleeding, trouble breathing, redness and pain at the surgery site, drainage, fever, or pain not relieved by medication.     Your post-operative visit with Dr.                                       is: Date:                        Time:    Please call to schedule your post-operative visit.  Additional Instructions:

## 2022-05-07 NOTE — Op Note (Signed)
Patient Name: Brandon Benitez  CWC:[id number]  Pre-Operative Diagnosis: Left knee quadriceps tendon tear  Post-Operative Diagnosis: Left knee quadriceps tendon tear  Procedure: Left knee quadriceps tendon open repair  Date of Surgery: 05/07/2022  Surgeon: Reinaldo Berber MD  Assistant: Amador Cunas (present and scrubbed throughout the case, critical for assistance with exposure, retraction, instrumentation, and closure)   Anesthesiologist: Woodson/Adams  Anesthesia: General   Tourniquet Time: 44 min  EBL: 20cc  IVF: 700cc  Complications: None   Brief history: The patient is a 61 year old male who injured his knee when jumping on 04/26/2022 and after MRI and ultrasound was found to have a quadriceps tendon tear avulsion off of the proximal pole of his left patella.  The risks and benefits of conservative versus surgical intervention were discussed at length with the patient and the patient opted for surgical fixation of his left quadriceps tendon. All preoperative films were reviewed and an appropriate surgical plan was made prior to surgery.  Description of procedure: The patient was brought to the operating room where laterality was confirmed by all those present to be the left side.   General anesthesia was administered and the patient received an intravenous dose of antibiotics for surgical prophylaxis.  Patient is positioned supine on the operating room table with all bony prominences well-padded.  A well-padded tourniquet was applied to the left thigh.  The knee was then prepped and draped in usual sterile fashion with multiple layers of adhesive and nonadhesive drapes.  All of those present in the operating room participated in a surgical timeout laterality and patient were confirmed.   An Esmarch was wrapped around the extremity and the leg was elevated.  The tourniquet was inflated to a pressure of 275 mmHg. The Esmarch was removed and the leg was brought down to full  extension.  The patella and tibial tubercle identified and outlined using a marking pen and a midline skin incision was made with a knife carried through the subcutaneous tissue down to the extensor retinaculum stopping at the distal pole of the patella.  After exposure of the extensor mechanism there was found to be a lateral proximal tear in the superficial retinaculum when exposing this tear it was found that only a thin superficial layer of the retinaculum and proximal quadriceps tendon complex was intact and the entire ventral 90% of the quadriceps tendon was avulsed from the proximal pole of the patella.  We encountered an old appearing serous hematoma within the knee joint.  The knee was then irrigated with saline and fully washed out.  The distal end of the stump of the quadriceps tendon was debrided of any loose hematoma tissue and fibrillations of tendon.  The proximal pole of the patella was cleared off of scar and soft tissue to allow for good landing zone for the quadriceps repair.   The quadriceps tendon was whipstitched with two #5 Ethibond sutures more than 6 proximal and distal rows of throws leading to 4 limbs of #5 Ethibond suture distally from the quadriceps tendon.  A 2 mm drill was used to make 3 subchondral tunnels through the patella from superior to inferior pole medial lateral and centrally.  A suture passer was used to pass a single limb of #5 Ethibond through each of the lateral and medial holes respectively and 2 limbs of the central suture were passed through the central hole.  The Ethibond sutures were tied over the inferior pole of the patella leading to concentric, even, and tight reduction of  the distal aspect of the quadriceps tendon to the superior pole the patella. The suture knots were then buried beneath the patellar tendon to help prevent superficial irritation.  The knee was then irrigated again with normal saline.  The medial and lateral retinacular tissues were closed  with 0 Vicryl.  And the lateral tear at the border of the quadriceps and the vastus lateralis was repaired up and down with a #2 barbed Quill suture.  The tourniquet was dropped and all bleeding was addressed with a clean dry wound bed prior to closure.  The knee was then brought into slight flexion showing no gapping of the suture repair and tendon repair.  The superficial tissues were closed with 0 Vicryl, 2-0 Vicryl, and the skin was closed with 3 oh monitoring Quill suture.  The skin was then closed with Dermabond skin glue and a sterile adhesive dressing was placed along with a Ace wrap and knee immobilizer.  Patient had a good distal pulse at the end of the case. All sponge needle and lap counts were correct at the end the case.  The patient was transferred off of the operating room table to a hospital bed, good pulses were found distally on the operative side.  The patient was transferred to the recovery room in stable condition.

## 2022-05-07 NOTE — Transfer of Care (Signed)
Immediate Anesthesia Transfer of Care Note  Patient: Brandon Benitez  Procedure(s) Performed: REPAIR QUADRICEP TENDON (Left: Knee)  Patient Location: PACU  Anesthesia Type:General  Level of Consciousness: drowsy and patient cooperative  Airway & Oxygen Therapy: Patient Spontanous Breathing and Patient connected to face mask oxygen  Post-op Assessment: Report given to RN and Post -op Vital signs reviewed and stable  Post vital signs: Reviewed and stable  Last Vitals:  Vitals Value Taken Time  BP 127/78 05/07/22 1618  Temp 36.2 C 05/07/22 1618  Pulse 82 05/07/22 1620  Resp 12 05/07/22 1620  SpO2 98 % 05/07/22 1620  Vitals shown include unvalidated device data.  Last Pain:  Vitals:   05/07/22 1316  TempSrc: Temporal  PainSc: 0-No pain         Complications: No notable events documented.

## 2022-05-09 NOTE — Anesthesia Postprocedure Evaluation (Signed)
Anesthesia Post Note  Patient: Brandon Benitez  Procedure(s) Performed: REPAIR QUADRICEP TENDON (Left: Knee)  Patient location during evaluation: PACU Anesthesia Type: General Level of consciousness: awake and alert Pain management: pain level controlled Vital Signs Assessment: post-procedure vital signs reviewed and stable Respiratory status: spontaneous breathing, nonlabored ventilation, respiratory function stable and patient connected to nasal cannula oxygen Cardiovascular status: blood pressure returned to baseline and stable Postop Assessment: no apparent nausea or vomiting Anesthetic complications: no   No notable events documented.   Last Vitals:  Vitals:   05/07/22 1645 05/07/22 1705  BP: (!) 118/54 130/73  Pulse: 81 71  Resp: 15 18  Temp: (!) 36.3 C (!) 36.1 C  SpO2: 98% 100%    Last Pain:  Vitals:   05/07/22 1705  TempSrc: Temporal  PainSc: 4                  Yevette Edwards

## 2022-05-10 ENCOUNTER — Encounter: Payer: Self-pay | Admitting: Orthopedic Surgery
